# Patient Record
Sex: Male | Born: 1940 | Race: White | Hispanic: No | Marital: Married | Smoking: Former smoker
Health system: Southern US, Community
[De-identification: ages and names within clinical notes are randomized; demographics above are authoritative.]

## PROBLEM LIST (undated history)

## (undated) DIAGNOSIS — E119 Type 2 diabetes mellitus without complications: Secondary | ICD-10-CM

## (undated) DIAGNOSIS — I10 Essential (primary) hypertension: Secondary | ICD-10-CM

## (undated) DIAGNOSIS — I4891 Unspecified atrial fibrillation: Secondary | ICD-10-CM

## (undated) DIAGNOSIS — F329 Major depressive disorder, single episode, unspecified: Secondary | ICD-10-CM

## (undated) DIAGNOSIS — K219 Gastro-esophageal reflux disease without esophagitis: Secondary | ICD-10-CM

## (undated) DIAGNOSIS — F32A Depression, unspecified: Secondary | ICD-10-CM

## (undated) DIAGNOSIS — M353 Polymyalgia rheumatica: Secondary | ICD-10-CM

## (undated) DIAGNOSIS — F419 Anxiety disorder, unspecified: Secondary | ICD-10-CM

## (undated) DIAGNOSIS — J449 Chronic obstructive pulmonary disease, unspecified: Secondary | ICD-10-CM

---

## 1998-04-20 ENCOUNTER — Emergency Department (HOSPITAL_COMMUNITY): Admission: EM | Admit: 1998-04-20 | Discharge: 1998-04-20 | Payer: Self-pay | Admitting: Emergency Medicine

## 1998-04-23 ENCOUNTER — Encounter: Admission: RE | Admit: 1998-04-23 | Discharge: 1998-04-23 | Payer: Self-pay | Admitting: Internal Medicine

## 1998-04-25 ENCOUNTER — Encounter: Payer: Self-pay | Admitting: Cardiology

## 1998-04-25 ENCOUNTER — Ambulatory Visit (HOSPITAL_COMMUNITY): Admission: RE | Admit: 1998-04-25 | Discharge: 1998-04-25 | Payer: Self-pay | Admitting: Cardiology

## 2005-12-31 ENCOUNTER — Encounter: Payer: Self-pay | Admitting: Emergency Medicine

## 2008-05-07 ENCOUNTER — Ambulatory Visit: Payer: Self-pay | Admitting: Family Medicine

## 2010-07-26 ENCOUNTER — Ambulatory Visit: Payer: Self-pay | Admitting: Internal Medicine

## 2014-04-24 ENCOUNTER — Inpatient Hospital Stay: Payer: Self-pay | Admitting: Internal Medicine

## 2014-04-24 LAB — BASIC METABOLIC PANEL
Anion Gap: 8 (ref 7–16)
BUN: 19 mg/dL — ABNORMAL HIGH (ref 7–18)
Calcium, Total: 8.6 mg/dL (ref 8.5–10.1)
Chloride: 104 mmol/L (ref 98–107)
Co2: 32 mmol/L (ref 21–32)
Creatinine: 0.91 mg/dL (ref 0.60–1.30)
EGFR (African American): >60
EGFR (Non-African Amer.): >60
Glucose: 185 mg/dL — ABNORMAL HIGH (ref 65–99)
Osmolality: 294 (ref 275–301)
Potassium: 3.8 mmol/L (ref 3.5–5.1)
Sodium: 144 mmol/L (ref 136–145)

## 2014-04-24 LAB — HEPATIC FUNCTION PANEL A (ARMC)
ALBUMIN: 3.2 g/dL — AB (ref 3.4–5.0)
ALT: 36 U/L
Alkaline Phosphatase: 99 U/L
Bilirubin, Direct: <0.1 mg/dL (ref 0.00–0.20)
Bilirubin,Total: 0.4 mg/dL (ref 0.2–1.0)
SGOT(AST): 12 U/L — ABNORMAL LOW (ref 15–37)
TOTAL PROTEIN: 6.8 g/dL (ref 6.4–8.2)

## 2014-04-24 LAB — CBC WITH DIFFERENTIAL/PLATELET
Basophil #: 0.1 10*3/uL (ref 0.0–0.1)
Basophil %: 0.8 %
Eosinophil #: 0.2 10*3/uL (ref 0.0–0.7)
Eosinophil %: 2 %
HCT: 46.9 % (ref 40.0–52.0)
HGB: 15.3 g/dL (ref 13.0–18.0)
Lymphocyte #: 1.9 10*3/uL (ref 1.0–3.6)
Lymphocyte %: 17.8 %
MCH: 28.8 pg (ref 26.0–34.0)
MCHC: 32.5 g/dL (ref 32.0–36.0)
MCV: 89 fL (ref 80–100)
Monocyte #: 0.7 x10 3/mm (ref 0.2–1.0)
Monocyte %: 7.1 %
Neutrophil #: 7.6 10*3/uL — ABNORMAL HIGH (ref 1.4–6.5)
Neutrophil %: 72.3 %
Platelet: 226 10*3/uL (ref 150–440)
RBC: 5.29 10*6/uL (ref 4.40–5.90)
RDW: 15.8 % — ABNORMAL HIGH (ref 11.5–14.5)
WBC: 10.4 10*3/uL (ref 3.8–10.6)

## 2014-04-24 LAB — CK TOTAL AND CKMB (NOT AT ARMC)
CK, Total: 9 U/L — ABNORMAL LOW
CK-MB: <0.5 ng/mL — ABNORMAL LOW (ref 0.5–3.6)

## 2014-04-24 LAB — HEMOGLOBIN A1C: Hemoglobin A1C: 6.4 % — ABNORMAL HIGH (ref 4.2–6.3)

## 2014-04-24 LAB — PRO B NATRIURETIC PEPTIDE: B-Type Natriuretic Peptide: 52 pg/mL (ref 0–125)

## 2014-04-24 LAB — TROPONIN I: Troponin-I: <0.02 ng/mL

## 2014-04-24 LAB — TSH: THYROID STIMULATING HORM: 2.21 u[IU]/mL

## 2014-04-25 LAB — CBC WITH DIFFERENTIAL/PLATELET
BASOS ABS: 0.1 10*3/uL (ref 0.0–0.1)
Basophil %: 0.9 %
EOS ABS: 0.2 10*3/uL (ref 0.0–0.7)
Eosinophil %: 1.8 %
HCT: 47.3 % (ref 40.0–52.0)
HGB: 15.1 g/dL (ref 13.0–18.0)
LYMPHS PCT: 19.3 %
Lymphocyte #: 2.2 10*3/uL (ref 1.0–3.6)
MCH: 28.6 pg (ref 26.0–34.0)
MCHC: 32 g/dL (ref 32.0–36.0)
MCV: 89 fL (ref 80–100)
MONOS PCT: 6.3 %
Monocyte #: 0.7 x10 3/mm (ref 0.2–1.0)
Neutrophil #: 8.2 10*3/uL — ABNORMAL HIGH (ref 1.4–6.5)
Neutrophil %: 71.7 %
PLATELETS: 254 10*3/uL (ref 150–440)
RBC: 5.29 10*6/uL (ref 4.40–5.90)
RDW: 15.6 % — AB (ref 11.5–14.5)
WBC: 11.4 10*3/uL — ABNORMAL HIGH (ref 3.8–10.6)

## 2014-04-25 LAB — COMPREHENSIVE METABOLIC PANEL
ALT: 39 U/L
Albumin: 3.3 g/dL — ABNORMAL LOW (ref 3.4–5.0)
Alkaline Phosphatase: 115 U/L
Anion Gap: 9 (ref 7–16)
BUN: 27 mg/dL — AB (ref 7–18)
Bilirubin,Total: 0.7 mg/dL (ref 0.2–1.0)
CHLORIDE: 100 mmol/L (ref 98–107)
CO2: 34 mmol/L — AB (ref 21–32)
CREATININE: 1.16 mg/dL (ref 0.60–1.30)
Calcium, Total: 8.4 mg/dL — ABNORMAL LOW (ref 8.5–10.1)
EGFR (Non-African Amer.): >60
Glucose: 158 mg/dL — ABNORMAL HIGH (ref 65–99)
Osmolality: 293 (ref 275–301)
Potassium: 4.2 mmol/L (ref 3.5–5.1)
SGOT(AST): 12 U/L — ABNORMAL LOW (ref 15–37)
Sodium: 143 mmol/L (ref 136–145)
Total Protein: 6.7 g/dL (ref 6.4–8.2)

## 2014-04-25 LAB — TROPONIN I: Troponin-I: <0.02 ng/mL

## 2014-04-25 LAB — CK-MB: CK-MB: <0.5 ng/mL — ABNORMAL LOW (ref 0.5–3.6)

## 2014-12-23 NOTE — Discharge Summary (Signed)
PATIENT NAME:  Tyrone Ruiz, Tyrone Ruiz MR#:  [id number] DATE OF BIRTH:  01-18-1941  ADMISSION DIAGNOSIS: Shortness of breath.   DISCHARGE DIAGNOSES: 1.  Pneumonia, community-acquired.  2.  Obesity hypoventilation.  3.  Diabetes.  4.  History of atrial flutter.  5.  Hypertension.   CONSULTATIONS:[facility].   PERTINENT LABORATORY DATA AND IMAGING: A 2D echocardiogram showed normal ejection fraction without any valvular abnormalities. Discharge white blood cells 11, hemoglobin 15, hematocrit 47.3 and platelets are 254,000, sodium 143, potassium 4.2, chloride 100, bicarbonate 34, BUN 27, creatinine 1.16, glucose is 157.   Hemoglobin A1c is 6.4.   CT of the chest showed no evidence of pulmonary emboli, but did reveal right upper lobe pneumonia.   ECHO normal EF no valvular abnormalities  HOSPITAL COURSE: A 74 year old morbidly obese male who presented with shortness of breath. For further details, please refer to the H and P.  1.  Community-acquired pneumonia. The patient was found to have a pneumonia, not on chest x-ray but on CT scan. He was empirically started on Levaquin 750 mg p.o. q, day. He will be treated for 5 days. He is actually asymptomatic from his pneumonia including no fevers, white count, or cough.  2.  Obesity hypoventilation. The patient described what appears to be hypoventilation syndrome. He has had a sleep study in the past prior to losing 76 pounds. At that time he  had a sleep study and did not require a CPAP machine, but was hypoxic during the day and night, but apparently he says he did not stop breathing so did not qualify for CPAP machine. He did need oxygen at that time, but did not obtain oxygen. He definitely needs 2 liters of oxygen for what appears to be obesity hypoventilation syndrome. He was found to be hypoxic here and this is not felt to be due to his pneumonia, but due to obesity hypoventilation as mentioned. His echocardiogram showed no signs of congestive heart  failure. No atrial flutter on telemetry. EKG initially on admission looked actually to be artifact. Appreciate cardiology consultation at this time. 3.   Diabetes, A1c was 6.4, well-controlled on metformin, ADA diet.   4.  History of atrial flutter, status post cardioversion. Appreciate cardiology consultation. The patient's rate is controlled.  He is on Pradaxa.  5.   Hypertension. The patient's blood pressure is controlled on metoprolol.   DISCHARGE MEDICATIONS: 1.  Metoprolol 50 mg daily.  2.  Pradaxa 20 mg daily.  3.  Lasix 40 mg p.r.n.  4.  Metformin 1000 mg b.i.d.  5.  Tamsulosin 0.4 mg 2 tablets daily.  6.  Vitamin B12, 1000 mcg daily.  7.  Omeprazole 20 mg daily. 8.  CoQ10, 100 mg 2 tablets b.i.d.  9.  L-carnitine 300 mg b.i.d.  10.  Prednisone 5 mg daily. 11.  Xanax 1 mg 4 times a day p.r.n. anxiety.  12.  Remeron 15 mg 2 tablets at bedtime.  13.  Aspirin 81 mg daily. 14.  Levaquin 750 mg daily for 4 days.   DISCHARGE OXYGEN: Two liters nasal cannula.   DISCHARGE DIET:  Low sodium.   DISCHARGE ACTIVITY: As tolerated.   DISCHARGE FOLLOWUP:  The patient will follow up with Dr. Youlanda Mighty at [facility].  TIME SPENT: Approximately 35 minutes. The patient is stable for discharge.    ____________________________ Candace Begue P. Juliene Pina, MD spm:LT D: 04/25/2014 14:40:43 ET T: 04/25/2014 22:10:50 ET JOB#: [id number]  cc: Raelynne Ludwick P. Juliene Pina, MD, <Dictator> Youlanda Mighty,  MD, [facility] Brandie Lopes P Prentis Langdon MD ELECTRONICALLY SIGNED 04/26/2014 10:29

## 2014-12-23 NOTE — H&P (Signed)
PATIENT NAME:  Tyrone PurpuraCRAVEN, Kyo J MR#:  [id number] DATE OF BIRTH:  [birth year]  DATE OF ADMISSION:  04/24/2014  ADDENDUM:  MEDICATIONS: The patient's medication list is as follows: Alprazolam 1 mg 4 times daily as needed, aspirin 81 mg p.o. daily, coenzyme Q10 at 100 mg 2 capsules twice daily, furosemide 40 mg p.o. daily as needed, l-carnitine 300 mg twice daily, metformin 1 gram twice daily, metoprolol succinate 50 mg p.o. daily, Remeron 30 mg p.o. at bedtime, omeprazole 10 mg p.o. daily, prednisone 5 mg p.o. daily, rivaroxaban 20 mg p.o. daily, tamsulosin 0.4 mg 2 capsules once daily, vitamin B12 at 1000 mcg p.o. daily, and Crestor 10 mg p.o. daily. The patient is not sure if Crestor dose is actually 10 mg.    ____________________________ Katharina Caperima Jenascia Bumpass, MD rv:lt D: 04/24/2014 15:25:45 ET T: 04/24/2014 16:01:50 ET JOB#: [id number]  cc: Katharina Caperima Blu Mcglaun, MD, <Dictator> Emmalin Jaquess MD ELECTRONICALLY SIGNED 05/24/2014 14:34

## 2014-12-23 NOTE — Consult Note (Signed)
Present Illness Patient is a 74 year old male with history of atrial fibrillation/atrial flutter [facility] by Dr. Youlanda MightyMichael Blazing.  He has been on a variety of anti arrhythmics and as undrgone   a number of cardioversion attempts.  He presents with complaints of hypoxia and shortness of breath.  [facility] was shortness of breath.  He was given IV diuretics and discharge back to home.  He has a history of chronic hypoxia per his report in [facility].  He had a sleep study in the distant past.  Patient states that he had no apneic episodes but was recommended to use CPAP.  He currently does not use this device.  He has an oxygen meter at home and states that his pulse oximetry her at room air at rest usually runs between 85 and 86. With ambulation usually drops to the low 80s.  He was admitted to our hospital with complaints of shortness of breath and pulse oximetries in the low 80s.  He was placed on nasal cannula oxygen.  Chest x-ray did not reveal significant pulmonary edema.  Echocardiogram with definity contrast added for clarity due to poor sound wave transmission suggested preserved left ventricular function.  Has ruled out for myocardia infarction.  He currently is resting comfortably with 2 L of oxygen.  When ambulating without oxygen his pulse oximetry drops to the low 80s.  There is no evidence of high-grade airspace disease he appears to be well compensated from a cardiac standpoint.  His electrocardiogram appears to show sinus rhythm.  Patient is anticoagulated with rivaroxaban is tolerating this fairly well.   Physical Exam:  GEN no acute distress, obese   HEENT PERRL   NECK supple   RESP normal resp effort  no use of accessory muscles  rhonchi   CARD Regular rate and rhythm  Distant heart sounds the   ABD denies tenderness  more  obese   LYMPH negative neck   EXTR negative  cyanosis/clubbing, positive edema   SKIN positive rashes   NEURO cranial nerves intact, motor/sensory function intact   PSYCH A+O to time, place, person   Review of Systems:  Subjective/Chief Complaint some shortness of breath with activity none at rest.   General: Fatigue   Skin: No Complaints   ENT: No Complaints   Eyes: No Complaints   Neck: No Complaints   Respiratory: Short of breath   Cardiovascular: No Complaints   Gastrointestinal: No Complaints   Genitourinary: No Complaints   Vascular: No Complaints   Musculoskeletal: No Complaints   Neurologic: No Complaints   Hematologic: No Complaints   Endocrine: No Complaints   Psychiatric: No Complaints   Review of Systems: All other systems were reviewed and found to be negative   Medications/Allergies Reviewed Medications/Allergies reviewed   Family & Social History:  Family and Social History:  Family History Non-Contributory   EKG:  EKG NSR   Abnormal NSSTTW changes    No Known Allergies:    Impression 74 year old with history of atrial fib / flutter.  He currently appears to be in sinus rhythm.  He is on metoprolol alone at present.  He is chronically anticoagulated with rivaroxaban.  [facility].  He was admitted with complaints of increasing shortness of breath and hypoxia.  Patient states that he is frequently  is hypoxic at home on room air.  Pulse oximetry drops  to the low 80s when ambulating during a hospitalization.  He states the same thing happens at home.  His echocardiogram with definity contrast revealed left ventricular function appeared normal.  He is back to his baseline per is report however he has had a sleep study in the distant past however this may need to be repeated given his somnolence during the day as well as relative fatigue.  Given his desaturation, would recommend home oxygen least temporarily until improve meds can be made in his  oxygenation.  Would continue with rate control with metoprolol and anticoagulation with rivaroxaban as well as symptomatic relief with diuretics including furosemide.   Plan 1. Continue with current medications including metoprolol succinate at 50 mg daily and rivaroxaban at 20 mg daily 2. Diuresis as needed 3. Would recommend home oxygen at 2 L per  min at least with ambulation and at night he if not with resting comfortably when his pulse oximetry drops below 86. 4. consider sleep study 5. ambulating consider discharge with outpatient follow-up with Dr. Youlanda Mighty at [facility].   Electronic Signatures: Dalia Heading (MD)  (Signed 25-Aug-15 12:19)  Authored: General Aspect/Present Illness, History and Physical Exam, Review of System, Family & Social History, EKG , Allergies, Impression/Plan   Last Updated: 25-Aug-15 12:19 by Dalia Heading (MD)

## 2014-12-23 NOTE — H&P (Signed)
PATIENT NAME:  Tyrone Ruiz, Tyrone Ruiz MR#:  [id number] DATE OF BIRTH:  1941/08/05  DATE OF ADMISSION:  04/24/2014  PRIMARY CARE PHYSICIAN: Dr. Youlanda Mighty, [facility].   HISTORY OF PRESENT ILLNESS: The patient is a 74 year old Caucasian male with past medical history significant for history of atrial flutter in the past, status post cardioversion x 5 history of obstructive sleep apnea, hypoxia during sleep; however, who did not qualify for free CPAP and also history of severe obesity, as well as diabetes mellitus who presents to the hospital with complaints of low oxygen levels. According to the patient, he was doing well up until approximately 3 days ago when he started having shortness of breath. He checked his pulse oximetry and it was between 80s to 90s, running 83% to 89%. He was not able to sleep well because of significant shortness of breath. Apparently in the past, he was seen at [facility] because of hypoxia. At that point, he was given Lasix and his hypoxia would improve. This time he came into the Emergency Room, his chest x-ray did not show any significant congestive heart failure but just bibasilar atelectasis and hospitalist services were contacted for admission. The patient's first EKG revealed uneven baseline which is difficult to rule out from atrial flutter reportedly accelerated junctional rhythm per EKG criteria. The patient denies any significant midsternal chest pain; however, admits to having right-sided chest pains which usually comes up whenever he takes deep breaths; however, seemed to be improving now with oxygen therapy. He is also admitting to fatigue and weakness, and significant dyspnea, especially whenever he walks. He walks at least 30 or 40 feet and then he becomes severely short of breath. He needs to stop at least for 15 minutes and his shortness of breath somewhat improved, but not significantly so. He tells me that his oxygen saturation is 92% whenever he  walks; however, it goes down to 80s whenever he is sitting down. Denies palpitations. As mentioned above he has had at least 5 cardioversions for atrial flutter in the past and did not tolerate medications for atrial flutter conversion and did not tolerate atrial flutter itself.   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 He was off statin Crestor for a year; however, he said myopathy did not improve so he is back now on Crestor.   PAST SURGICAL HISTORY: Significant for cardioversions only. The first one was 10 years ago; at least 5 of them.   FAMILY HISTORY: Hypertension as well as diabetes mellitus in the patient's father. The patient's mother died of pancreatic cancer at age of 80.   SOCIAL HISTORY: The patient is married, has 2 children, lives with his wife. He used to smoke 1 cigar a week for 1 year only and quit 15 years ago. No alcohol abuse. He used to work as an Airline pilot. Retired now.   REVIEW OF SYSTEMS: Positive for fatigue and weakness, pains in the right upper quadrant, right lower chest area increasing with breathing and it is intermittent. Also, progression of cataracts. Takes naps during the daytime due to poor sleep at nighttime; however, the past 3 days  he has not been able to sleep well because of significant shortness of breath. Denies any swelling in his lower extremities. Admits to significant shortness of breath, especially on exertion; walks just 30 feet or so and then gets very short of breath. Ten years ago, was evaluated by the sleep study however, did not stop  breathing and that is why Medicaid did not qualify him for CPAP mask. He had repeated study done after weight loss of approximately 60 to 70 pounds and, at that point, he also was recommended CPAP mask; however, did not receive it since he was not qualified for it. 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 Weak muscles and legs. He is not able to walk in general in the house too much. Denies any fevers or chills, weight loss or gain.  EYES: Denies blurry vision, double vision, glaucoma.  EAR, NOSE AND THROAT: Denies any tinnitus, difficulty hearing or swallowing, no sore throat.  CARDIOVASCULAR: Denies chest pains at present, paplitations, swollen feet.  GASTROINTESTINAL: Denies any vomiting, diarrhea or constipation.  GENITOURINARY: Denies dysuria, hematuria, frequency, incontinence. SKIN: Denies any acne rash, lesions.  MUSCULOSKELETAL: Denies arthritis, swelling, difficulty walking.  PSYCHIATRIC: Denies anxiety, insomnia depression.  On arrival to the hospital, patient's vital signs: Temperature is 98.2, pulse was 83, blood pressure 130/57, saturation was 95% on oxygen therapy and was 83% on room air.  GENERAL: Obese, Caucasian male in no significant distress. CARDIOVASCULAR: S1, S2 present.Rhythm was regular, no murmurs, gallops, rubs. EXTREMITIES: No lower extremity edema, calf tenderness or cyanosis was noted.  ABDOMEN: Protuberant, nontender, soft, no hepatosplenomegaly or masses were noted. Mild discomfort in the right upper quadrant during palpation.  MUSCLE STRENGTH: Able to move all extremities. No cyanosis, degenerative joint disease. SKIN: Did not reveal any rashes, lesions, erythema, nodularity, or induration. No calve tenderness or cyanosis. LYMPHATIC: No adenopathy in the region.  NEUROLOGIC: Cranial nerves grossly  intact. Sensory is intact. No discharge or aphasia. The patient is alert oriented to time, person and place, cooperative. Memory is good.  PSYCHIATRIC: No significant confusion, agitation, or depression noted.   DIAGNOSTIC DATA: EKG showed accelerated junctional rhythm at 78 beats per minute, normal axis, no acute ST-T changes. However, the patient's baseline is very abnormal and I am having difficulty completely ruling out atrial flutter wave. A new EKG is ordered; however, has not received yet.  The patient's lab data: Glucose 185, BUN of 19, beta-type natriuretic peptide was only 52. Otherwise , BMP was unremarkable. Troponin less than 0.02. CBC: White blood cell count of 10.4, hemoglobin 15.3, platelet count 226,000. Absolute neutrophil count was 7.6.   RADIOLOGIC STUDIES: Chest x-ray, portable single view, 04/24/2014, showed a mild bibasilar atelectasis . No focal confluent infiltrate or other abnormalities.   ASSESSMENT AND PLAN: 1. Acute respiratory failure. Admit the patient to the medical floor for evaluation. Suspect obesity hypoventilation as one of reasons why the patient has significant hypoxia,; however, it could be that he has also atrial flutter intermittently, which he did not tolerate well in the past and could be also that he has mild congestive heart failure which is not obvious chest x-ray; however, we need to rule out pulmonary embolism; although, the patient is on Xarelto already. We will get CT scan of the chest with intravenous contrast. We will get repeat EKG done and get  cardiologist involved. Will check cardiac enzymes x 3. We'll initiate CPAP mask. We will get echocardiogram and see if this would help to qualify patient for CPAP mask.  2. Questionable atrial flutter. Will repeat EKG . We will hold metoprolol for now. Will admit patient to telemetry floor. We will also get cardiologist involved for further recommendations.  3. Diabetes mellitus. Get hemoglobin A1c. Will  continue home medications as well as sliding scale insulin.  4. Hypertension. We will continue home medications. 5. Hyperlipidemia. Continue Crestor.   TIME SPENT: 50 minutes    ____________________________ Katharina Caperima Alaija Ruble, MD rv:NT D: 04/24/2014 15:20:01 ET T: 04/24/2014 16:07:34 ET JOB#: [id number]  cc: Katharina Caperima Armarion Greek, MD, <Dictator> Nyala Kirchner MD ELECTRONICALLY SIGNED 05/29/2014 20:54

## 2015-08-24 ENCOUNTER — Encounter: Payer: Self-pay | Admitting: Emergency Medicine

## 2015-08-24 ENCOUNTER — Observation Stay
Admission: EM | Admit: 2015-08-24 | Discharge: 2015-08-25 | Disposition: A | Discharge status: Home / Self Care | Payer: Medicare Other | Attending: Internal Medicine | Admitting: Internal Medicine

## 2015-08-24 ENCOUNTER — Emergency Department: Payer: Medicare Other

## 2015-08-24 DIAGNOSIS — I6523 Occlusion and stenosis of bilateral carotid arteries: Secondary | ICD-10-CM | POA: Insufficient documentation

## 2015-08-24 DIAGNOSIS — E119 Type 2 diabetes mellitus without complications: Secondary | ICD-10-CM

## 2015-08-24 DIAGNOSIS — Z7901 Long term (current) use of anticoagulants: Secondary | ICD-10-CM | POA: Diagnosis not present

## 2015-08-24 DIAGNOSIS — Z9981 Dependence on supplemental oxygen: Secondary | ICD-10-CM | POA: Diagnosis not present

## 2015-08-24 DIAGNOSIS — E785 Hyperlipidemia, unspecified: Secondary | ICD-10-CM | POA: Diagnosis not present

## 2015-08-24 DIAGNOSIS — M8580 Other specified disorders of bone density and structure, unspecified site: Secondary | ICD-10-CM | POA: Insufficient documentation

## 2015-08-24 DIAGNOSIS — J449 Chronic obstructive pulmonary disease, unspecified: Secondary | ICD-10-CM | POA: Insufficient documentation

## 2015-08-24 DIAGNOSIS — I119 Hypertensive heart disease without heart failure: Secondary | ICD-10-CM | POA: Diagnosis not present

## 2015-08-24 DIAGNOSIS — G459 Transient cerebral ischemic attack, unspecified: Secondary | ICD-10-CM | POA: Diagnosis present

## 2015-08-24 DIAGNOSIS — R4701 Aphasia: Secondary | ICD-10-CM | POA: Insufficient documentation

## 2015-08-24 DIAGNOSIS — I482 Chronic atrial fibrillation, unspecified: Secondary | ICD-10-CM | POA: Diagnosis present

## 2015-08-24 DIAGNOSIS — M353 Polymyalgia rheumatica: Secondary | ICD-10-CM | POA: Insufficient documentation

## 2015-08-24 DIAGNOSIS — Z7984 Long term (current) use of oral hypoglycemic drugs: Secondary | ICD-10-CM | POA: Insufficient documentation

## 2015-08-24 DIAGNOSIS — F419 Anxiety disorder, unspecified: Secondary | ICD-10-CM | POA: Diagnosis not present

## 2015-08-24 DIAGNOSIS — Z7952 Long term (current) use of systemic steroids: Secondary | ICD-10-CM | POA: Diagnosis not present

## 2015-08-24 DIAGNOSIS — K219 Gastro-esophageal reflux disease without esophagitis: Secondary | ICD-10-CM | POA: Diagnosis not present

## 2015-08-24 DIAGNOSIS — F329 Major depressive disorder, single episode, unspecified: Secondary | ICD-10-CM | POA: Diagnosis not present

## 2015-08-24 DIAGNOSIS — I10 Essential (primary) hypertension: Secondary | ICD-10-CM | POA: Diagnosis present

## 2015-08-24 DIAGNOSIS — Z87891 Personal history of nicotine dependence: Secondary | ICD-10-CM | POA: Diagnosis not present

## 2015-08-24 DIAGNOSIS — Z79899 Other long term (current) drug therapy: Secondary | ICD-10-CM | POA: Diagnosis not present

## 2015-08-24 DIAGNOSIS — R531 Weakness: Secondary | ICD-10-CM | POA: Insufficient documentation

## 2015-08-24 DIAGNOSIS — L8990 Pressure ulcer of unspecified site, unspecified stage: Secondary | ICD-10-CM | POA: Insufficient documentation

## 2015-08-24 HISTORY — DX: Major depressive disorder, single episode, unspecified: F32.9

## 2015-08-24 HISTORY — DX: Gastro-esophageal reflux disease without esophagitis: K21.9

## 2015-08-24 HISTORY — DX: Type 2 diabetes mellitus without complications: E11.9

## 2015-08-24 HISTORY — DX: Essential (primary) hypertension: I10

## 2015-08-24 HISTORY — DX: Anxiety disorder, unspecified: F41.9

## 2015-08-24 HISTORY — DX: Unspecified atrial fibrillation: I48.91

## 2015-08-24 HISTORY — DX: Depression, unspecified: F32.A

## 2015-08-24 HISTORY — DX: Polymyalgia rheumatica: M35.3

## 2015-08-24 LAB — COMPREHENSIVE METABOLIC PANEL
ALT: 10 U/L — ABNORMAL LOW (ref 17–63)
AST: 12 U/L — AB (ref 15–41)
Albumin: 4.1 g/dL (ref 3.5–5.0)
Alkaline Phosphatase: 75 U/L (ref 38–126)
Anion gap: 11 (ref 5–15)
BUN: 20 mg/dL (ref 6–20)
CHLORIDE: 101 mmol/L (ref 101–111)
CO2: 30 mmol/L (ref 22–32)
Calcium: 9 mg/dL (ref 8.9–10.3)
Creatinine, Ser: 0.8 mg/dL (ref 0.61–1.24)
Glucose, Bld: 131 mg/dL — ABNORMAL HIGH (ref 65–99)
POTASSIUM: 4.1 mmol/L (ref 3.5–5.1)
Sodium: 142 mmol/L (ref 135–145)
Total Bilirubin: 0.7 mg/dL (ref 0.3–1.2)
Total Protein: 6.9 g/dL (ref 6.5–8.1)

## 2015-08-24 LAB — CBC
HCT: 45 % (ref 40.0–52.0)
Hemoglobin: 14.5 g/dL (ref 13.0–18.0)
MCH: 27.5 pg (ref 26.0–34.0)
MCHC: 32.2 g/dL (ref 32.0–36.0)
MCV: 85.6 fL (ref 80.0–100.0)
PLATELETS: 251 10*3/uL (ref 150–440)
RBC: 5.26 MIL/uL (ref 4.40–5.90)
RDW: 15.3 % — AB (ref 11.5–14.5)
WBC: 10.1 10*3/uL (ref 3.8–10.6)

## 2015-08-24 LAB — PROTIME-INR
INR: 1.81
PROTHROMBIN TIME: 20.9 s — AB (ref 11.4–15.0)

## 2015-08-24 LAB — TROPONIN I

## 2015-08-24 MED ORDER — MIRTAZAPINE 30 MG PO TABS
30.0000 mg | ORAL_TABLET | Freq: Every day | ORAL | Status: DC
Start: 2015-08-24 — End: 2015-08-25
  Administered 2015-08-24: 30 mg via ORAL
  Filled 2015-08-24 (×2): qty 1

## 2015-08-24 MED ORDER — ALPRAZOLAM 1 MG PO TABS
1.0000 mg | ORAL_TABLET | Freq: Four times a day (QID) | ORAL | Status: DC | PRN
  Administered 2015-08-24 – 2015-08-25 (×2): 1 mg via ORAL
  Filled 2015-08-24: qty 1
  Filled 2015-08-24: qty 2

## 2015-08-24 MED ORDER — ROSUVASTATIN CALCIUM 10 MG PO TABS
10.0000 mg | ORAL_TABLET | Freq: Every day | ORAL | Status: DC
Start: 2015-08-25 — End: 2015-08-25
  Administered 2015-08-24: 10 mg via ORAL
  Filled 2015-08-24 (×3): qty 1

## 2015-08-24 NOTE — ED Notes (Signed)
Patient to rm 18 via EMS from home.  Per EMS and patient - he was sitting on bedside commode and was straining.  Reports he felt strange and tried to communicate with his wife, but that she could not hear/understand him.

## 2015-08-24 NOTE — ED Notes (Signed)
Patient transported to CT 

## 2015-08-24 NOTE — ED Provider Notes (Signed)
[facility] Emergency Department Provider Note  Time seen: 9:17 PM  I have reviewed the triage vital signs and the nursing notes.   HISTORY  Chief Complaint Weakness    HPI Tyrone Deskins Sr. is a 74 y.o. male with a past medical history of diabetes, gastric reflux, morbid obesity, atrial fibrillation on xarelto who presents the emergency department for aphasia. According to the patient he was using the bathroom, had just finished when he was speaking with his wife he states he was talking to her but she states that she could not understand anything he was saying. She states it was just jumbled words that did not make sense, but the patient believed he was making sense. Patient denies any weakness or numbness of any arm or leg, confusion. Patient recalls the event states he feels like he was making sense, the wife states this lasted for approximately 30 minutes before slowly resolved. Patient denies any history of this in the past, denies any history of stroke in the past. Denies any headache.     Past Medical History  Diagnosis Date  . Diabetes mellitus without complication (HCC)   . Atrial fibrillation (HCC)   . GERD (gastroesophageal reflux disease)     There are no active problems to display for this patient.   History reviewed. No pertinent past surgical history.  No current outpatient prescriptions on file.  Allergies Review of patient's allergies indicates no known allergies.  No family history on file.  Social History Social History  Substance Use Topics  . Smoking status: Former Games developer  . Smokeless tobacco: Never Used  . Alcohol Use: No    Review of Systems Constitutional: Negative for fever. Cardiovascular: Negative for chest pain. Respiratory: Negative for shortness of breath. Gastrointestinal: Negative for abdominal pain Neurological: Negative for headache 10-point ROS otherwise  negative.  ____________________________________________   PHYSICAL EXAM:  VITAL SIGNS: ED Triage Vitals  Enc Vitals Group     BP 08/24/15 2109 125/72 mmHg     Pulse Rate 08/24/15 2109 82     Resp 08/24/15 2109 20     Temp 08/24/15 2111 97.5 F (36.4 C)     Temp Source 08/24/15 2111 Oral     SpO2 08/24/15 2111 99 %     Weight 08/24/15 2109 341 lb 6 oz (154.847 kg)     Height 08/24/15 2109  (1.854 m)     Head Cir --      Peak Flow --      Pain Score --      Pain Loc --      Pain Edu? --      Excl. in GC? --     Constitutional: Alert and oriented. Well appearing and in no distress. Eyes: Normal exam ENT   Head: Normocephalic and atraumatic.   Mouth/Throat: Mucous membranes are moist. Cardiovascular: Normal rate, regular rhythm. No murmur Respiratory: Normal respiratory effort without tachypnea nor retractions. Breath sounds are clear and equal bilaterally. No wheezes/rales/rhonchi. Gastrointestinal: Soft and nontender. No distention.  Musculoskeletal: Nontender with normal range of motion in all extremities.  Neurologic:  Normal speech and language. No gross focal neurologic deficits. Cranial nerves intact. Equal grip strengths.  Skin:  Skin is warm, dry and intact.  Psychiatric: Mood and affect are normal. Speech and behavior are normal.   ____________________________________________    EKG  EKG reviewed and interpreted by myself shows atrial fibrillation at 82 bpm, narrow QRS, left axis deviation, nonspecific ST changes.  ____________________________________________    RADIOLOGY  CT head shows no acute abnormality  ____________________________________________   INITIAL IMPRESSION / ASSESSMENT AND PLAN / ED COURSE  Pertinent labs & imaging results that were available during my care of the patient were reviewed by me and considered in my medical decision making (see chart for details).  Patient presents with symptoms most suggestive of transient  ischemic attack with what appears to be consistent with Wernicke's aphasia. We will check labs, CT head, and closely monitor in the emergency department. The patient does have an underlying history of atrial fibrillation however states he takes xarelto daily and denies missing any dosages.  Labs and CT a largely within normal limits besides a slightly elevated INR, patient is not on Coumadin. Normal LFTs. Given the patient's concerning symptoms for a transient ischemic attack, the patient will be admitted to the hospital for further treatment and evaluation.  ____________________________________________   FINAL CLINICAL IMPRESSION(S) / ED DIAGNOSES  Transient ischemic attack   Minna AntisKevin Taichi Repka, MD 08/24/15 2251

## 2015-08-24 NOTE — ED Notes (Signed)
Per [facility] pt census is over their grid so charge [facility] and [facility] whether to [facility] more pt's.

## 2015-08-24 NOTE — H&P (Signed)
[facility] -  at [facility]   PATIENT NAME: Tyrone Ruiz    MR#:  [id number]  DATE OF BIRTH:  11/23/1940  DATE OF ADMISSION:  08/24/2015  PRIMARY CARE PHYSICIAN: [facility]   REQUESTING/REFERRING PHYSICIAN: Lenard LancePaduchowski, MD  CHIEF COMPLAINT:   Chief Complaint  Patient presents with  . Weakness    HISTORY OF PRESENT ILLNESS:  Tyrone Ruiz  is a 74 y.o. male who presents with an episode of speech difficulty. Patient states that earlier this evening he was at home with his wife. He states that he was using his bedside commode and straining to have a bowel movement. Immediately after that he began have difficulty speaking. Patient states that he was able to think about what he wanted to say, but that everything came out garbled and "mumbling". This lasted for about 5 minutes, then the patient was slowly able to get order to out here or there. By 10 or 15 minutes later his speech had completely returned to normal. Patient is in chronic A. fib, but is anticoagulated. Hospitalists were called for admission for TIA.  PAST MEDICAL HISTORY:   Past Medical History  Diagnosis Date  . Diabetes mellitus without complication (HCC)   . Atrial fibrillation (HCC)   . GERD (gastroesophageal reflux disease)   . HTN (hypertension)   . Anxiety   . Depression   . PMR (polymyalgia rheumatica) (HCC)     PAST SURGICAL HISTORY:  History reviewed. No pertinent past surgical history.  SOCIAL HISTORY:   Social History  Substance Use Topics  . Smoking status: Former Games developermoker  . Smokeless tobacco: Never Used  . Alcohol Use: No    FAMILY HISTORY:   Family History  Problem Relation Age of Onset  . Hypertension Father   . Diabetes Father   . Cancer Father   . Stroke Father   . Alcohol abuse Father   . Depression Sister   . Diabetes Sister   . Cancer Mother   . Stroke Mother     DRUG ALLERGIES:  No Known Allergies  MEDICATIONS AT HOME:    Prior to Admission medications   Medication Sig Start Date End Date Taking? Authorizing Provider  ALPRAZolam Prudy Feeler(XANAX) 1 MG tablet Take 1 mg by mouth 4 (four) times daily as needed for anxiety.   Yes Historical Provider, MD  busPIRone (BUSPAR) 10 MG tablet Take 5 mg by mouth 2 (two) times daily.    Yes Historical Provider, MD  furosemide (LASIX) 40 MG tablet Take 40 mg by mouth daily as needed for fluid.    Yes Historical Provider, MD  glimepiride (AMARYL) 4 MG tablet Take 4 mg by mouth 2 (two) times daily as needed.    Yes Historical Provider, MD  metFORMIN (GLUCOPHAGE) 1000 MG tablet Take 1,000 mg by mouth 2 (two) times daily with a meal.   Yes Historical Provider, MD  metoprolol succinate (TOPROL-XL) 50 MG 24 hr tablet Take 50 mg by mouth daily. Pt states that he takes anywhere from 2-4 tablets daily depending on A-FIB.   Yes Historical Provider, MD  mirtazapine (REMERON) 30 MG tablet Take 30 mg by mouth at bedtime. Take 2 tablets.   Yes Historical Provider, MD  predniSONE (DELTASONE) 5 MG tablet Take 5 mg by mouth daily with breakfast.   Yes Historical Provider, MD  rivaroxaban (XARELTO) 20 MG TABS tablet Take 20 mg by mouth daily with supper.   Yes Historical Provider, MD  rosuvastatin (CRESTOR) 10 MG  tablet Take 1 tablet by mouth daily. 07/05/15  Yes Historical Provider, MD  tamsulosin (FLOMAX) 0.4 MG CAPS capsule Take 2 capsules by mouth daily. 08/18/15  Yes Historical Provider, MD    REVIEW OF SYSTEMS:  Review of Systems  Constitutional: Negative for fever, chills, weight loss and malaise/fatigue.  HENT: Negative for ear pain, hearing loss and tinnitus.   Eyes: Negative for blurred vision, double vision, pain and redness.  Respiratory: Negative for cough, hemoptysis and shortness of breath.   Cardiovascular: Negative for chest pain, palpitations, orthopnea and leg swelling.  Gastrointestinal: Negative for nausea, vomiting, abdominal pain, diarrhea and constipation.  Genitourinary:  Negative for dysuria, frequency and hematuria.  Musculoskeletal: Negative for back pain, joint pain and neck pain.  Skin:       No acne, rash, or lesions  Neurological: Positive for speech change. Negative for dizziness, tremors, focal weakness and weakness.  Endo/Heme/Allergies: Negative for polydipsia. Does not bruise/bleed easily.  Psychiatric/Behavioral: Negative for depression. The patient is not nervous/anxious and does not have insomnia.      VITAL SIGNS:   Filed Vitals:   08/24/15 2111 08/24/15 2130 08/24/15 2200 08/24/15 2230  BP:  127/69 101/61 94/48  Pulse:    87  Temp: 97.5 F (36.4 C)     TempSrc: Oral     Resp:   19 18  Height:      Weight:      SpO2: 99%   93%   Wt Readings from Last 3 Encounters:  08/24/15 154.847 kg (341 lb 6 oz)    PHYSICAL EXAMINATION:  Physical Exam  Vitals reviewed. Constitutional: He is oriented to person, place, and time. He appears well-developed and well-nourished. No distress.  HENT:  Head: Normocephalic and atraumatic.  Mouth/Throat: Oropharynx is clear and moist.  Eyes: Conjunctivae and EOM are normal. Pupils are equal, round, and reactive to light. No scleral icterus.  Neck: Normal range of motion. Neck supple. No JVD present. No thyromegaly present.  Cardiovascular: Normal rate and intact distal pulses.  Exam reveals no gallop and no friction rub.   No murmur heard. Irregular rhythm  Respiratory: Effort normal and breath sounds normal. No respiratory distress. He has no wheezes. He has no rales.  GI: Soft. Bowel sounds are normal. He exhibits no distension. There is no tenderness.  Musculoskeletal: Normal range of motion. He exhibits no edema.  No arthritis, no gout  Lymphadenopathy:    He has no cervical adenopathy.  Neurological: He is alert and oriented to person, place, and time. No cranial nerve deficit.  Neurologic: Cranial nerves II-XII intact, Sensation intact to light touch/pinprick, 5/5 strength in all extremities,  no dysarthria, no aphasia, no dysphagia, memory intact, finger to nose testing showed no abnormality, no pronator drift, DTR intact, Babinski sign not present.   Skin: Skin is warm and dry. No rash noted. No erythema.  Psychiatric: He has a normal mood and affect. His behavior is normal. Judgment and thought content normal.    LABORATORY PANEL:   CBC  Recent Labs Lab 08/24/15 2147  WBC 10.1  HGB 14.5  HCT 45.0  PLT 251   ------------------------------------------------------------------------------------------------------------------  Chemistries   Recent Labs Lab 08/24/15 2147  NA 142  K 4.1  CL 101  CO2 30  GLUCOSE 131*  BUN 20  CREATININE 0.80  CALCIUM 9.0  AST 12*  ALT 10*  ALKPHOS 75  BILITOT 0.7   ------------------------------------------------------------------------------------------------------------------  Cardiac Enzymes  Recent Labs Lab 08/24/15 2147  TROPONINI <0.03   ------------------------------------------------------------------------------------------------------------------  RADIOLOGY:  Ct Head Wo Contrast  08/24/2015  CLINICAL DATA:  Initial evaluation for acute aphasia, now resolved. EXAM: CT HEAD WITHOUT CONTRAST TECHNIQUE: Contiguous axial images were obtained from the base of the skull through the vertex without intravenous contrast. COMPARISON:  None. FINDINGS: Age appropriate cerebral atrophy present. Very mild chronic small vessel ischemic type changes within the periventricular white matter. Vascular calcifications within the carotid siphons. No acute large vessel territory infarct. No intracranial hemorrhage. No mass lesion, midline shift, or mass effect. No hydrocephalus. No extra axial fluid collection. Scalp soft tissues within normal limits. No acute abnormality about the orbits. Partially visualized paranasal sinuses are clear. Visualized mastoid air cells clear. Calvarium intact.  Hyperostosis frontalis interna noted.  IMPRESSION: 1. No acute intracranial process. 2. Mild age related cerebral atrophy with mild chronic small vessel ischemic disease. Electronically Signed   By: Rise Mu M.D.   On: 08/24/2015 21:42    EKG:   Orders placed or performed during the hospital encounter of 08/24/15  . EKG 12-Lead  . EKG 12-Lead    IMPRESSION AND PLAN:  Principal Problem:   TIA (transient ischemic attack) - we'll admit for standard stroke/TIA workup, including MRI/MRA, carotid Doppler, echocardiogram, fasting lipids, A1c, neurology consult. Active Problems:   Type 2 diabetes mellitus (HCC) - heart healthy/carb modified diet once he passes his nursing swallow screen along with sliding scale insulin and corresponding glucose checks. Hemoglobin A1c as above.   Chronic a-fib (HCC) - patient is on rate controlling medicines with a good rate, and is artery anticoagulated. Continue these treatments.   HTN (hypertension) - blood pressure is well controlled, continue home meds   PMR (polymyalgia rheumatica) (HCC) - on chronic prednisone, will not stop this here.   Anxiety - continue home anxiolytic   HLD (hyperlipidemia) - continue home cholesterol medicine  All the records are reviewed and case discussed with ED provider. Management plans discussed with the patient and/or family.  DVT PROPHYLAXIS: Systemic anticoagulation  GI PROPHYLAXIS: None  ADMISSION STATUS: Observation  CODE STATUS: Full Advance Directive Documentation        Most Recent Value   Type of Advance Directive  Living will   Pre-existing out of facility DNR order (yellow form or pink MOST form)     "MOST" Form in Place?        TOTAL TIME TAKING CARE OF THIS PATIENT: 40 minutes.    Cuauhtemoc Huegel FIELDING 08/24/2015, 11:08 PM  [facility]  Office  [phone]  CC: Primary care physician; [facility]

## 2015-08-25 ENCOUNTER — Observation Stay: Payer: Medicare Other

## 2015-08-25 ENCOUNTER — Observation Stay (HOSPITAL_BASED_OUTPATIENT_CLINIC_OR_DEPARTMENT_OTHER)
Admit: 2015-08-25 | Discharge: 2015-08-25 | Disposition: A | Discharge status: Home / Self Care | Payer: Medicare Other | Attending: Internal Medicine | Admitting: Internal Medicine

## 2015-08-25 DIAGNOSIS — G451 Carotid artery syndrome (hemispheric): Secondary | ICD-10-CM | POA: Diagnosis not present

## 2015-08-25 DIAGNOSIS — G459 Transient cerebral ischemic attack, unspecified: Secondary | ICD-10-CM

## 2015-08-25 DIAGNOSIS — L8990 Pressure ulcer of unspecified site, unspecified stage: Secondary | ICD-10-CM | POA: Insufficient documentation

## 2015-08-25 LAB — LIPID PANEL
Cholesterol: 102 mg/dL (ref 0–200)
HDL: 30 mg/dL — ABNORMAL LOW (ref >40)
LDL Cholesterol: 45 mg/dL (ref 0–99)
Total CHOL/HDL Ratio: 3.4 RATIO
Triglycerides: 135 mg/dL (ref <150)
VLDL: 27 mg/dL (ref 0–40)

## 2015-08-25 LAB — GLUCOSE, CAPILLARY
GLUCOSE-CAPILLARY: 151 mg/dL — AB (ref 65–99)
GLUCOSE-CAPILLARY: 183 mg/dL — AB (ref 65–99)

## 2015-08-25 LAB — HEMOGLOBIN A1C: Hgb A1c MFr Bld: 6.9 % — ABNORMAL HIGH (ref 4.0–6.0)

## 2015-08-25 MED ORDER — TAMSULOSIN HCL 0.4 MG PO CAPS
0.8000 mg | ORAL_CAPSULE | Freq: Every day | ORAL | Status: DC
  Filled 2015-08-25: qty 2

## 2015-08-25 MED ORDER — METFORMIN HCL 500 MG PO TABS
1000.0000 mg | ORAL_TABLET | Freq: Two times a day (BID) | ORAL | Status: DC
  Administered 2015-08-25: 08:00:00 1000 mg via ORAL
  Filled 2015-08-25: qty 2

## 2015-08-25 MED ORDER — ASPIRIN 325 MG PO TABS: 325.0000 mg | ORAL_TABLET | Freq: Every day | ORAL | Status: DC

## 2015-08-25 MED ORDER — METFORMIN HCL 1000 MG PO TABS: 1000.0000 mg | ORAL_TABLET | Freq: Two times a day (BID) | ORAL | Status: DC

## 2015-08-25 MED ORDER — METOPROLOL SUCCINATE ER 50 MG PO TB24
50.0000 mg | ORAL_TABLET | Freq: Every day | ORAL | Status: DC
  Administered 2015-08-25: 50 mg via ORAL
  Filled 2015-08-25: qty 1

## 2015-08-25 MED ORDER — FUROSEMIDE 40 MG PO TABS: 40.0000 mg | ORAL_TABLET | Freq: Every day | ORAL | Status: DC | PRN

## 2015-08-25 MED ORDER — INSULIN ASPART 100 UNIT/ML ~~LOC~~ SOLN: 0.0000 [IU] | Freq: Every day | SUBCUTANEOUS | Status: DC

## 2015-08-25 MED ORDER — METFORMIN HCL 1000 MG PO TABS: 1000.0000 mg | ORAL_TABLET | Freq: Two times a day (BID) | ORAL | Status: AC

## 2015-08-25 MED ORDER — RIVAROXABAN 20 MG PO TABS: 20.0000 mg | ORAL_TABLET | Freq: Every day | ORAL | Status: DC

## 2015-08-25 MED ORDER — INSULIN ASPART 100 UNIT/ML ~~LOC~~ SOLN
0.0000 [IU] | Freq: Three times a day (TID) | SUBCUTANEOUS | Status: DC
  Filled 2015-08-25 (×2): qty 1

## 2015-08-25 MED ORDER — PREDNISONE 5 MG PO TABS
5.0000 mg | ORAL_TABLET | Freq: Every day | ORAL | Status: DC
  Administered 2015-08-25: 08:00:00 5 mg via ORAL
  Filled 2015-08-25: qty 1

## 2015-08-25 MED ORDER — STROKE: EARLY STAGES OF RECOVERY BOOK
Freq: Once | Status: AC
  Administered 2015-08-25: 01:00:00

## 2015-08-25 MED ORDER — SENNOSIDES-DOCUSATE SODIUM 8.6-50 MG PO TABS: 1.0000 | ORAL_TABLET | Freq: Every evening | ORAL | Status: DC | PRN

## 2015-08-25 MED ORDER — BUSPIRONE HCL 10 MG PO TABS
5.0000 mg | ORAL_TABLET | Freq: Two times a day (BID) | ORAL | Status: DC
  Filled 2015-08-25: qty 1

## 2015-08-25 MED ORDER — METOPROLOL SUCCINATE ER 50 MG PO TB24: 50.0000 mg | ORAL_TABLET | Freq: Every day | ORAL | Status: AC

## 2015-08-25 NOTE — Discharge Summary (Signed)
[facility] - [place] at [facility]   PATIENT NAME: Tyrone Ruiz    MR#:  [id number]  DATE OF BIRTH:  08-02-1941  DATE OF ADMISSION:  08/24/2015 ADMITTING PHYSICIAN: Oralia Manis, MD  DATE OF DISCHARGE: 08/25/15  PRIMARY CARE PHYSICIAN: [facility]    ADMISSION DIAGNOSIS:  TIA (transient ischemic attack) [G45.9] Transient cerebral ischemia, unspecified transient cerebral ischemia type [G45.9]  DISCHARGE DIAGNOSIS:  Principal Problem:   TIA (transient ischemic attack) Active Problems:   Type 2 diabetes mellitus (HCC)   Chronic a-fib (HCC)   Anxiety   HTN (hypertension)   HLD (hyperlipidemia)   PMR (polymyalgia rheumatica) (HCC)   Pressure ulcer   SECONDARY DIAGNOSIS:   Past Medical History  Diagnosis Date  . Diabetes mellitus without complication (HCC)   . Atrial fibrillation (HCC)   . GERD (gastroesophageal reflux disease)   . HTN (hypertension)   . Anxiety   . Depression   . PMR (polymyalgia rheumatica) [facility])     HOSPITAL COURSE:   74 year old male with past medical history significant for diabetes mellitus, chronic atrial fibrillation on Xarelto, GERD, hypertension, polymyalgia, COPD on home oxygen presents to the hospital secondary to speech changes.  #1 TIA-possible transient hypoxia or hypotension post-straining. Likely a vagal episode. -Speech changes are completely resolved. No other neurological deficits noted. -MRI cannot be done due to his weight and size. CT of the head negative for any acute findings on admission. -Carotid Dopplers with no hemodynamically significant stenosis. -Stable neuro checks. -Appreciate neurology consult. -Patient will be discharged home. No changes in medications. -Already on Xarelto and statin  #2 chronic atrial fibrillation-rate controlled-on metoprolol. -Blood pressure on low normal side. Advise to hold metoprolol as needed. -Xarelto for anticoagulation  #3 polymyalgia-stable at  baseline. On low-dose prednisone chronically.  #4 diabetes mellitus-on metformin and glimepiride.  #5 anxiety-continue home medications without any changes.  Patient is stable and will be discharged home today.   DISCHARGE CONDITIONS:   Stable  CONSULTS OBTAINED:  Treatment Team:  Pauletta Browns, MD  DRUG ALLERGIES:  No Known Allergies  DISCHARGE MEDICATIONS:   Current Discharge Medication List    CONTINUE these medications which have CHANGED   Details  metFORMIN (GLUCOPHAGE) 1000 MG tablet Take 1 tablet (1,000 mg total) by mouth 2 (two) times daily with a meal. Qty: 60 tablet, Refills: 2    metoprolol succinate (TOPROL-XL) 50 MG 24 hr tablet Take 1 tablet (50 mg total) by mouth daily. HOLD MEDICINE IF BLOOD PRESSURE IS LOW, LIKE IF THE TOP (SYSTOLIC) IS <100 OR BOTTOM (DIASTOLIC)< 55 Qty: 30 tablet, Refills: 0      CONTINUE these medications which have NOT CHANGED   Details  ALPRAZolam (XANAX) 1 MG tablet Take 1 mg by mouth 4 (four) times daily as needed for anxiety.    busPIRone (BUSPAR) 10 MG tablet Take 5 mg by mouth 2 (two) times daily.     furosemide (LASIX) 40 MG tablet Take 40 mg by mouth daily as needed for fluid.     glimepiride (AMARYL) 4 MG tablet Take 4 mg by mouth 2 (two) times daily as needed.     mirtazapine (REMERON) 30 MG tablet Take 30 mg by mouth at bedtime. Take 2 tablets.    predniSONE (DELTASONE) 5 MG tablet Take 5 mg by mouth daily with breakfast.    rivaroxaban (XARELTO) 20 MG TABS tablet Take 20 mg by mouth daily with supper.    rosuvastatin (CRESTOR) 10 MG tablet Take  1 tablet by mouth daily. Refills: 3    tamsulosin (FLOMAX) 0.4 MG CAPS capsule Take 2 capsules by mouth daily.         DISCHARGE INSTRUCTIONS:   1. PCP f/u in 1-2 weeks  If you experience worsening of your admission symptoms, develop shortness of breath, life threatening emergency, suicidal or homicidal thoughts you must seek medical attention immediately by  calling 911 or calling your MD immediately  if symptoms less severe.  You Must read complete instructions/literature along with all the possible adverse reactions/side effects for all the Medicines you take and that have been prescribed to you. Take any new Medicines after you have completely understood and accept all the possible adverse reactions/side effects.   Please note  You were cared for by a hospitalist during your hospital stay. If you have any questions about your discharge medications or the care you received while you were in the hospital after you are discharged, you can call the unit and asked to speak with the hospitalist on call if the hospitalist that took care of you is not available. Once you are discharged, your primary care physician will handle any further medical issues. Please note that NO REFILLS for any discharge medications will be authorized once you are discharged, as it is imperative that you return to your primary care physician (or establish a relationship with a primary care physician if you do not have one) for your aftercare needs so that they can reassess your need for medications and monitor your lab values.    Today   CHIEF COMPLAINT:   Chief Complaint  Patient presents with  . Weakness    VITAL SIGNS:  Blood pressure 100/58, pulse 88, temperature 97.7 F (36.5 C), temperature source Oral, resp. rate 20, height  (1.854 m), weight 154.677 kg (341 lb), SpO2 97 %.  I/O:   Intake/Output Summary (Last 24 hours) at 08/25/15 1313 Last data filed at 08/25/15 0800  Gross per 24 hour  Intake      0 ml  Output      0 ml  Net      0 ml    PHYSICAL EXAMINATION:   Physical Exam  GENERAL:  74 y.o.-year-old Obese patient sitting in the bed with no acute distress.  EYES: Pupils equal, round, reactive to light and accommodation. No scleral icterus. Extraocular muscles intact.  HEENT: Head atraumatic, normocephalic. Oropharynx and nasopharynx clear.   NECK:  Supple, no jugular venous distention. No thyroid enlargement, no tenderness.  LUNGS: Normal breath sounds bilaterally, no wheezing, rales,rhonchi or crepitation. No use of accessory muscles of respiration.  CARDIOVASCULAR: S1, S2 normal. No murmurs, rubs, or gallops.  ABDOMEN: Soft, non-tender, non-distended. Bowel sounds present. No organomegaly or mass.  EXTREMITIES: No pedal edema, cyanosis, or clubbing.  NEUROLOGIC: Cranial nerves II through XII are intact. Muscle strength 5/5 in all extremities. Sensation intact. Gait not checked.  PSYCHIATRIC: The patient is alert and oriented x 3.  SKIN: No obvious rash, lesion, or ulcer.   DATA REVIEW:   CBC  Recent Labs Lab 08/24/15 2147  WBC 10.1  HGB 14.5  HCT 45.0  PLT 251    Chemistries   Recent Labs Lab 08/24/15 2147  NA 142  K 4.1  CL 101  CO2 30  GLUCOSE 131*  BUN 20  CREATININE 0.80  CALCIUM 9.0  AST 12*  ALT 10*  ALKPHOS 75  BILITOT 0.7    Cardiac Enzymes  Recent Labs Lab 08/24/15 2147  TROPONINI <0.03    Microbiology Results  No results found for this or any previous visit.  RADIOLOGY:  Ct Head Wo Contrast  08/24/2015  CLINICAL DATA:  Initial evaluation for acute aphasia, now resolved. EXAM: CT HEAD WITHOUT CONTRAST TECHNIQUE: Contiguous axial images were obtained from the base of the skull through the vertex without intravenous contrast. COMPARISON:  None. FINDINGS: Age appropriate cerebral atrophy present. Very mild chronic small vessel ischemic type changes within the periventricular white matter. Vascular calcifications within the carotid siphons. No acute large vessel territory infarct. No intracranial hemorrhage. No mass lesion, midline shift, or mass effect. No hydrocephalus. No extra axial fluid collection. Scalp soft tissues within normal limits. No acute abnormality about the orbits. Partially visualized paranasal sinuses are clear. Visualized mastoid air cells clear. Calvarium intact.   Hyperostosis frontalis interna noted. IMPRESSION: 1. No acute intracranial process. 2. Mild age related cerebral atrophy with mild chronic small vessel ischemic disease. Electronically Signed   By: Rise MuBenjamin  McClintock M.D.   On: 08/24/2015 21:42   [place] Carotid Bilateral  08/25/2015  CLINICAL DATA:  74 year old male with symptoms of transient ischemic attack EXAM: BILATERAL CAROTID DUPLEX ULTRASOUND TECHNIQUE: Wallace CullensGray scale imaging, color Doppler and duplex ultrasound were performed of bilateral carotid and vertebral arteries in the neck. COMPARISON:  Head CT 08/24/2015 FINDINGS: Criteria: Quantification of carotid stenosis is based on velocity parameters that correlate the residual internal carotid diameter with NASCET-based stenosis levels, using the diameter of the distal internal carotid lumen as the denominator for stenosis measurement. The following velocity measurements were obtained: RIGHT ICA:  69/9 cm/sec CCA:  66/11 cm/sec SYSTOLIC ICA/CCA RATIO:  1.0 DIASTOLIC ICA/CCA RATIO:  0.8 ECA:  63 cm/sec LEFT ICA:  87/14 cm/sec CCA:  113/20 cm/sec SYSTOLIC ICA/CCA RATIO:  0.8 DIASTOLIC ICA/CCA RATIO:  0.7 ECA:  82 cm/sec RIGHT CAROTID ARTERY: Small focal heterogeneous atherosclerotic plaque in the proximal internal carotid artery. By peak systolic velocity criteria the estimated stenosis remains less than 50%. RIGHT VERTEBRAL ARTERY:  Patent with antegrade flow. LEFT CAROTID ARTERY: Trace heterogeneous atherosclerotic plaque in the carotid bifurcation and proximal internal carotid artery. By peak systolic velocity criteria, the estimated stenosis remains less than 50%. LEFT VERTEBRAL ARTERY:  Patent with antegrade flow. IMPRESSION: 1. 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 2. Mild (1-49%) stenosis proximal left internal carotid artery secondary to mild heterogeneous atherosclerotic plaque. 3. Vertebral arteries are patent with  normal antegrade flow. Signed, Sterling BigHeath K. McCullough, MD [facility] Electronically Signed   By: Malachy MoanHeath  McCullough M.D.   On: 08/25/2015 10:54    EKG:   Orders placed or performed during the hospital encounter of 08/24/15  . EKG 12-Lead  . EKG 12-Lead      Management plans discussed with the patient, family and they are in agreement.  CODE STATUS:     Code Status Orders        Start     Ordered   08/25/15 0040  Full code   Continuous     08/25/15 0039    Advance Directive Documentation        Most Recent Value   Type of Advance Directive  Living will   Pre-existing out of facility DNR order (yellow form or pink MOST form)     "MOST" Form in Place?        TOTAL TIME TAKING CARE OF THIS PATIENT: 37 minutes.    Leandrew KoyanagiKALISETTI,Leyli Kevorkian M.D  on 08/25/2015 at 1:13 PM  Between 7am to 6pm - Pager - [phone]  After 6pm go to [email or web address] - password [email or web address] [facility]  [facility]  Office  [phone]  CC: Primary care physician; [facility]

## 2015-08-25 NOTE — Plan of Care (Signed)
Problem: Education: Goal: Knowledge of [facility] Education information/materials will improve Outcome: Progressing Stroke handout and education provided this shift.  Patient educated on s/s of stroke as well as risk factors.  Patient education provided on cognitive checks and symptoms to alert the nurse.  Problem: Safety: Goal: Ability to remain free from injury will improve Outcome: Progressing Patient is morbidly obese who is able to use a standard walker for short distances.  BSC provided.  Patient preference to sleep in chair instead of bed.  Feet elevated and chair alarm on throughout shift.  Call bell within reach and patient educated on alerting staff for any needs.  Hourly rounding to assess needs.    Problem: Self-Care: Goal: Ability to participate in self-care as condition permits will improve Outcome: Progressing Patient able to perform self-care but maximum assistance is needed to aid in [facility].  Patient unable to wipe himself or ascertain cleanliness after using BSC.

## 2015-08-25 NOTE — Progress Notes (Signed)
PT Cancellation Note  Patient Details Name: Tyrone BelfastMatthew Joseph Stehr Sr. MRN: [id number] DOB: 07/28/1941   Cancelled Treatment:    Reason Eval/Treat Not Completed: Patient at procedure or test/unavailable.  Will try later as time allows.   Ivar DrapeStout, Maricsa Sammons E 08/25/2015, 10:55 AM   Samul Dadauth Akita Maxim, PT MS Acute Rehab Dept. Number: [facility] [phone] and [phone]

## 2015-08-25 NOTE — Progress Notes (Signed)
Speech Therapy Note: received order, reviewed chart notes and consulted NSG re: pt's status this AM. NSG reported no overt deficits; pt was swallowing pills w/ water w/ NSG(while in room and drinking from the water jug) and communicating wants/needs appropriately. Upon meeting pt in room, pt stated same. He denied any s/s of dysphagia and was verbally communicating w/ NSG and SLP  As pt denies any ST needs at this time, ST services will sign off but be available for any further if pt's status changes. NSG agreed. Pt agreed.

## 2015-08-25 NOTE — Care Management Obs Status (Signed)
MEDICARE OBSERVATION STATUS NOTIFICATION   Patient Details  Name: Tyrone BelfastMatthew Joseph Bily Sr. MRN: [id number] Date of Birth: 05/06/1941   Medicare Observation Status Notification Given:  No (inhouse less than 24 hours)    Caren MacadamMichelle Shubh Chiara, RN 08/25/2015, 4:44 PM

## 2015-08-25 NOTE — Progress Notes (Signed)
Notified Dr. Nemiah CommanderKalisetti that patient only wants 1 unit of insulin instead of 2 units of insulin for FSBS of 183. Okay to only give 1 unit of insulin, per Dr. Nemiah CommanderKalisetti.

## 2015-08-25 NOTE — Progress Notes (Signed)
Patient discharged home per MD order. IV removed.  Discharge instructions, prescriptions, medicines, activity, follow up care, plan of care and diet given to patient and family. All questions answered. Patient verbalized understanding. Patient left via wheelchair with nursing staff and wife.

## 2015-08-25 NOTE — Evaluation (Signed)
Occupational Therapy Evaluation Patient Details Name: Tyrone Teegarden Sr. MRN: [id number] DOB: [birth year] Today's Date: 08/25/2015    History of Present Illness Patient reports he was at home and fell asleep in the chair, woke up to go to the bathroom and when he went to speak to his wife, his speech was garbled.  Reports he knew what he wanted to say but couldn't say it.  His wife called EMS and he was transported to [facility] for further evaluation.  He feels like he is pretty much back to his baseline but does feel slightly weak and somewhat lightheaded at times.  Denies any pain.   Clinical Impression   Patient was seen for OT evaluation this date, appears close to baseline for level of care.  He has had increasing difficulties with performing self care tasks at home due to his size and his wife has been helping him with bathing, lower body dressing and hygiene after BM.  He presents this date with mild muscle weakness in left shoulder compared to right and generalized weakness when performing self care tasks and functional mobility.  If he remains in acute care, he would likely benefit from 1-2 OT visits for strengthening of LUE as well as toilet transfers and functional mobility skills as it relates to self care tasks.  He would benefit from A/E instruction for lower body self care tasks to improve his independence at home.     Follow Up Recommendations  No OT follow up    Equipment Recommendations       Recommendations for Other Services       Precautions / Restrictions Precautions Precautions: Fall Restrictions Weight Bearing Restrictions: No      Mobility Bed Mobility                  Transfers Overall transfer level: Modified independent Equipment used: Standard walker             General transfer comment: sit to stand transfer with modified independence.  Toilet transfer with supervision due to patient reporting feeling light headed at times.     Balance                                            ADL Overall ADL's : Needs assistance/impaired Eating/Feeding: Independent   Grooming: Modified independent   Upper Body Bathing: Modified independent   Lower Body Bathing: Maximal assistance   Upper Body Dressing : Modified independent   Lower Body Dressing: Maximal assistance   Toilet Transfer: Supervision/safety   Toileting- Clothing Manipulation and Hygiene: Maximal assistance               Vision     Perception     Praxis      Pertinent Vitals/Pain Pain Assessment: No/denies pain     Hand Dominance Left   Extremity/Trunk Assessment Upper Extremity Assessment Upper Extremity Assessment: Overall WFL for tasks assessed (Patient with mild LUE weakness at the shoulder, 4-/5 strength for shoulder flexion.)   Lower Extremity Assessment Lower Extremity Assessment: Defer to PT evaluation       Communication Communication Communication: No difficulties   Cognition Arousal/Alertness: Awake/alert Behavior During Therapy: WFL for tasks assessed/performed Overall Cognitive Status: Within Functional Limits for tasks assessed                    Patient seen for  toileting skills this date, supervision for transfer to bariatric BSC, patient complaints of mild light headedness.  Requires assistance for hygiene.  General Comments       Exercises       Shoulder Instructions      Home Living Family/patient expects to be discharged to:: Private residence Living Arrangements: Spouse/significant other Available Help at Discharge: Family Type of Home: House Home Access: Stairs to enter Secretary/administratorntrance Stairs-Number of Steps: 3   Home Layout: One level     Bathroom Shower/Tub: Producer, television/film/videoWalk-in shower   Bathroom Toilet: Handicapped height Bathroom Accessibility: Yes How Accessible: Accessible via walker Home Equipment: Walker - 2 wheels;Shower seat;Bedside commode          Prior  Functioning/Environment Level of Independence: Needs assistance  Gait / Transfers Assistance Needed: ambulated with a walker for about 40-50 feet at home before fatigue.  ADL's / Homemaking Assistance Needed: Wife assisted patient with bathing, dressing lower body for sock, shoes and underwear, assistance for hygiene after BM.  Wife performs all household tasks.  Patient does not drive.         OT Diagnosis: Generalized weakness   OT Problem List: Decreased strength;Obesity   OT Treatment/Interventions: Self-care/ADL training;Therapeutic exercise;Patient/family education;DME and/or AE instruction    OT Goals(Current goals can be found in the care plan section) Acute Rehab OT Goals Patient Stated Goal: "i want to get back home today." OT Goal Formulation: With patient Time For Goal Achievement: 09/01/15 Potential to Achieve Goals: Good  OT Frequency: Min 1X/week   Barriers to D/C:            Co-evaluation              End of Session    Activity Tolerance: Patient tolerated treatment well Patient left: in chair;with call bell/phone within reach;with chair alarm set   Time: [id number] OT Time Calculation (min): 25 min Charges:  OT General Charges $OT Visit: 1 Procedure OT Evaluation $Initial OT Evaluation Tier I: 1 Procedure OT Treatments $Self Care/Home Management : 8-22 mins G-Codes: OT G-codes **NOT FOR INPATIENT CLASS** Functional Assessment Tool Used: clinical judgment, ADL assessment, strength testing Functional Limitation: Self care Self Care Current Status ([id number]): At least 40 percent but less than 60 percent impaired, limited or restricted Self Care Goal Status (N5621(G8988): At least 20 percent but less than 40 percent impaired, limited or restricted  Jadie Allington 08/25/2015, 11:35 AM

## 2015-08-25 NOTE — Consult Note (Signed)
CC: speech difficulty  HPI: Tyrone Garron Sr. is an 74 y.o. male presents with an episode of speech difficulty. Patient states that earlier last evening he was at home with his wife. This lasted for about 5 minutes, then the patient was slowly able to get order to out here or there.  Back to baseline. Pt has history of A-fib   Past Medical History  Diagnosis Date  . Diabetes mellitus without complication (HCC)   . Atrial fibrillation (HCC)   . GERD (gastroesophageal reflux disease)   . HTN (hypertension)   . Anxiety   . Depression   . PMR (polymyalgia rheumatica) (HCC)     History reviewed. No pertinent past surgical history.  Family History  Problem Relation Age of Onset  . Hypertension Father   . Diabetes Father   . Cancer Father   . Stroke Father   . Alcohol abuse Father   . Depression Sister   . Diabetes Sister   . Cancer Mother   . Stroke Mother     Social History:  reports that he has quit smoking. He has never used smokeless tobacco. He reports that he does not drink alcohol or use illicit drugs.  No Known Allergies  Medications: I have reviewed the patient's current medications.  ROS: History obtained from the patient  General ROS: negative for - chills, fatigue, fever, night sweats, weight gain or weight loss Psychological ROS: negative for - behavioral disorder, hallucinations, memory difficulties, mood swings or suicidal ideation Ophthalmic ROS: negative for - blurry vision, double vision, eye pain or loss of vision ENT ROS: negative for - epistaxis, nasal discharge, oral lesions, sore throat, tinnitus or vertigo Allergy and Immunology ROS: negative for - hives or itchy/watery eyes Hematological and Lymphatic ROS: negative for - bleeding problems, bruising or swollen lymph nodes Endocrine ROS: negative for - galactorrhea, hair pattern changes, polydipsia/polyuria or temperature intolerance Respiratory ROS: negative for - cough, hemoptysis,  shortness of breath or wheezing Cardiovascular ROS: negative for - chest pain, dyspnea on exertion, edema or irregular heartbeat Gastrointestinal ROS: negative for - abdominal pain, diarrhea, hematemesis, nausea/vomiting or stool incontinence Genito-Urinary ROS: negative for - dysuria, hematuria, incontinence or urinary frequency/urgency Musculoskeletal ROS: negative for - joint swelling or muscular weakness Neurological ROS: as noted in HPI Dermatological ROS: negative for rash and skin lesion changes  Physical Examination: Blood pressure 115/60, pulse 82, temperature 97.9 F (36.6 C), temperature source Oral, resp. rate 18, height  (1.854 m), weight 341 lb (154.677 kg), SpO2 98 %.   Neurological Examination Mental Status: Alert, oriented, thought content appropriate.  Speech fluent without evidence of aphasia.  Able to follow 3 step commands without difficulty. Cranial Nerves: II: Discs flat bilaterally; Visual fields grossly normal, pupils equal, round, reactive to light and accommodation III,IV, VI: ptosis not present, extra-ocular motions intact bilaterally V,VII: smile symmetric, facial light touch sensation normal bilaterally VIII: hearing normal bilaterally IX,X: gag reflex present XI: bilateral shoulder shrug XII: midline tongue extension Motor: Right : Upper extremity   5/5    Left:     Upper extremity   5/5  Lower extremity   5/5     Lower extremity   5/5 Tone and bulk:normal tone throughout; no atrophy noted Sensory: Pinprick and light touch intact throughout, bilaterally Deep Tendon Reflexes: 1+ and symmetric throughout Plantars: Right: downgoing   Left: downgoing Cerebellar: normal finger-to-nose, normal rapid alternating movements and normal heel-to-shin test Gait: not tested       Laboratory  Studies:   Basic Metabolic Panel:  Recent Labs Lab 08/24/15 2147  NA 142  K 4.1  CL 101  CO2 30  GLUCOSE 131*  BUN 20  CREATININE 0.80  CALCIUM 9.0     Liver Function Tests:  Recent Labs Lab 08/24/15 2147  AST 12*  ALT 10*  ALKPHOS 75  BILITOT 0.7  PROT 6.9  ALBUMIN 4.1   No results for input(s): LIPASE, AMYLASE in the last 168 hours. No results for input(s): AMMONIA in the last 168 hours.  CBC:  Recent Labs Lab 08/24/15 2147  WBC 10.1  HGB 14.5  HCT 45.0  MCV 85.6  PLT 251    Cardiac Enzymes:  Recent Labs Lab 08/24/15 2147  TROPONINI <0.03    BNP: Invalid input(s): POCBNP  CBG:  Recent Labs Lab 08/25/15 0819  GLUCAP 151*    Microbiology: No results found for this or any previous visit.  Coagulation Studies:  Recent Labs  08/24/15 2147  LABPROT 20.9*  INR 1.81    Urinalysis: No results for input(s): COLORURINE, LABSPEC, PHURINE, GLUCOSEU, HGBUR, BILIRUBINUR, KETONESUR, PROTEINUR, UROBILINOGEN, NITRITE, LEUKOCYTESUR in the last 168 hours.  Invalid input(s): APPERANCEUR  Lipid Panel:     Component Value Date/Time   CHOL 102 08/25/2015 0502   TRIG 135 08/25/2015 0502   HDL 30* 08/25/2015 0502   CHOLHDL 3.4 08/25/2015 0502   VLDL 27 08/25/2015 0502   LDLCALC 45 08/25/2015 0502    HgbA1C:  Lab Results  Component Value Date   HGBA1C 6.4* 04/24/2014    Urine Drug Screen:  No results found for: LABOPIA, COCAINSCRNUR, LABBENZ, AMPHETMU, THCU, LABBARB  Alcohol Level: No results for input(s): ETH in the last 168 hours.  Other results: EKG: afib.  Imaging: Ct Head Wo Contrast  08/24/2015  CLINICAL DATA:  Initial evaluation for acute aphasia, now resolved. EXAM: CT HEAD WITHOUT CONTRAST TECHNIQUE: Contiguous axial images were obtained from the base of the skull through the vertex without intravenous contrast. COMPARISON:  None. FINDINGS: Age appropriate cerebral atrophy present. Very mild chronic small vessel ischemic type changes within the periventricular white matter. Vascular calcifications within the carotid siphons. No acute large vessel territory infarct. No intracranial  hemorrhage. No mass lesion, midline shift, or mass effect. No hydrocephalus. No extra axial fluid collection. Scalp soft tissues within normal limits. No acute abnormality about the orbits. Partially visualized paranasal sinuses are clear. Visualized mastoid air cells clear. Calvarium intact.  Hyperostosis frontalis interna noted. IMPRESSION: 1. No acute intracranial process. 2. Mild age related cerebral atrophy with mild chronic small vessel ischemic disease. Electronically Signed   By: Rise MuBenjamin  McClintock M.D.   On: 08/24/2015 21:42   [place] Carotid Bilateral  08/25/2015  CLINICAL DATA:  74 year old male with symptoms of transient ischemic attack EXAM: BILATERAL CAROTID DUPLEX ULTRASOUND TECHNIQUE: Wallace CullensGray scale imaging, color Doppler and duplex ultrasound were performed of bilateral carotid and vertebral arteries in the neck. COMPARISON:  Head CT 08/24/2015 FINDINGS: Criteria: Quantification of carotid stenosis is based on velocity parameters that correlate the residual internal carotid diameter with NASCET-based stenosis levels, using the diameter of the distal internal carotid lumen as the denominator for stenosis measurement. The following velocity measurements were obtained: RIGHT ICA:  69/9 cm/sec CCA:  66/11 cm/sec SYSTOLIC ICA/CCA RATIO:  1.0 DIASTOLIC ICA/CCA RATIO:  0.8 ECA:  63 cm/sec LEFT ICA:  87/14 cm/sec CCA:  113/20 cm/sec SYSTOLIC ICA/CCA RATIO:  0.8 DIASTOLIC ICA/CCA RATIO:  0.7 ECA:  82 cm/sec RIGHT CAROTID ARTERY: Small focal heterogeneous  atherosclerotic plaque in the proximal internal carotid artery. By peak systolic velocity criteria the estimated stenosis remains less than 50%. RIGHT VERTEBRAL ARTERY:  Patent with antegrade flow. LEFT CAROTID ARTERY: Trace heterogeneous atherosclerotic plaque in the carotid bifurcation and proximal internal carotid artery. By peak systolic velocity criteria, the estimated stenosis remains less than 50%. LEFT VERTEBRAL ARTERY:  Patent with antegrade flow.  IMPRESSION: 1. 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 2. Mild (1-49%) stenosis proximal left internal carotid artery secondary to mild heterogeneous atherosclerotic plaque. 3. Vertebral arteries are patent with normal antegrade flow. Signed, Sterling Big, MD [facility] Electronically Signed   By: Malachy Moan M.D.   On: 08/25/2015 10:54     Assessment/Plan:   74 y.o. male presents with an episode of speech difficulty. Patient states that earlier last evening he was at home with his wife. This lasted for about 5 minutes, then the patient was slowly able to get order to out here or there.  Back to baseline. Pt has history of A-fib on xarelto only missed a dose last week  - Does not fit into MRI which will not change management - con't xarelto - d/c planning today    Pauletta Browns  08/25/2015, 11:15 AM

## 2015-08-28 LAB — GLUCOSE, CAPILLARY: GLUCOSE-CAPILLARY: 125 mg/dL — AB (ref 65–99)

## 2016-01-04 ENCOUNTER — Encounter: Payer: Self-pay | Admitting: Emergency Medicine

## 2016-01-04 ENCOUNTER — Emergency Department
Admission: EM | Admit: 2016-01-04 | Discharge: 2016-01-04 | Disposition: A | Discharge status: Home / Self Care | Payer: Medicare Other | Attending: Emergency Medicine | Admitting: Emergency Medicine

## 2016-01-04 DIAGNOSIS — I10 Essential (primary) hypertension: Secondary | ICD-10-CM | POA: Insufficient documentation

## 2016-01-04 DIAGNOSIS — Z7984 Long term (current) use of oral hypoglycemic drugs: Secondary | ICD-10-CM | POA: Diagnosis not present

## 2016-01-04 DIAGNOSIS — J449 Chronic obstructive pulmonary disease, unspecified: Secondary | ICD-10-CM | POA: Insufficient documentation

## 2016-01-04 DIAGNOSIS — E119 Type 2 diabetes mellitus without complications: Secondary | ICD-10-CM | POA: Diagnosis not present

## 2016-01-04 DIAGNOSIS — I482 Chronic atrial fibrillation: Secondary | ICD-10-CM | POA: Insufficient documentation

## 2016-01-04 DIAGNOSIS — Z87891 Personal history of nicotine dependence: Secondary | ICD-10-CM | POA: Insufficient documentation

## 2016-01-04 DIAGNOSIS — Z8673 Personal history of transient ischemic attack (TIA), and cerebral infarction without residual deficits: Secondary | ICD-10-CM | POA: Insufficient documentation

## 2016-01-04 DIAGNOSIS — H9192 Unspecified hearing loss, left ear: Secondary | ICD-10-CM | POA: Diagnosis present

## 2016-01-04 DIAGNOSIS — E785 Hyperlipidemia, unspecified: Secondary | ICD-10-CM | POA: Insufficient documentation

## 2016-01-04 DIAGNOSIS — H6122 Impacted cerumen, left ear: Secondary | ICD-10-CM | POA: Insufficient documentation

## 2016-01-04 DIAGNOSIS — F329 Major depressive disorder, single episode, unspecified: Secondary | ICD-10-CM | POA: Diagnosis not present

## 2016-01-04 HISTORY — DX: Chronic obstructive pulmonary disease, unspecified: J44.9

## 2016-01-04 LAB — COMPREHENSIVE METABOLIC PANEL
ALT: 11 U/L — ABNORMAL LOW (ref 17–63)
AST: 17 U/L (ref 15–41)
Albumin: 3.8 g/dL (ref 3.5–5.0)
Alkaline Phosphatase: 89 U/L (ref 38–126)
Anion gap: 12 (ref 5–15)
BUN: 20 mg/dL (ref 6–20)
CHLORIDE: 104 mmol/L (ref 101–111)
CO2: 28 mmol/L (ref 22–32)
Calcium: 8.9 mg/dL (ref 8.9–10.3)
Creatinine, Ser: 0.73 mg/dL (ref 0.61–1.24)
Glucose, Bld: 207 mg/dL — ABNORMAL HIGH (ref 65–99)
POTASSIUM: 4.7 mmol/L (ref 3.5–5.1)
Sodium: 144 mmol/L (ref 135–145)
Total Bilirubin: 0.8 mg/dL (ref 0.3–1.2)
Total Protein: 6.6 g/dL (ref 6.5–8.1)

## 2016-01-04 LAB — CBC WITH DIFFERENTIAL/PLATELET
BASOS ABS: 0.1 10*3/uL (ref 0–0.1)
EOS ABS: 0.2 10*3/uL (ref 0–0.7)
Eosinophils Relative: 2 %
HCT: 43.9 % (ref 40.0–52.0)
HEMOGLOBIN: 14.2 g/dL (ref 13.0–18.0)
Lymphocytes Relative: 14 %
Lymphs Abs: 1.7 10*3/uL (ref 1.0–3.6)
MCH: 27.7 pg (ref 26.0–34.0)
MCHC: 32.3 g/dL (ref 32.0–36.0)
MCV: 85.8 fL (ref 80.0–100.0)
Monocytes Absolute: 0.5 10*3/uL (ref 0.2–1.0)
Monocytes Relative: 4 %
Neutro Abs: 9.8 10*3/uL — ABNORMAL HIGH (ref 1.4–6.5)
PLATELETS: 233 10*3/uL (ref 150–440)
RBC: 5.11 MIL/uL (ref 4.40–5.90)
RDW: 15.3 % — AB (ref 11.5–14.5)
WBC: 12.3 10*3/uL — AB (ref 3.8–10.6)

## 2016-01-04 LAB — APTT: APTT: 36 s (ref 24–36)

## 2016-01-04 LAB — PROTIME-INR
INR: 1.28
PROTHROMBIN TIME: 16.1 s — AB (ref 11.4–15.0)

## 2016-01-04 MED ORDER — CARBAMIDE PEROXIDE 6.5 % OT SOLN
3.0000 [drp] | Freq: Two times a day (BID) | OTIC | Status: AC
Start: 2016-01-04 — End: 2017-01-03

## 2016-01-04 MED ORDER — AMOXICILLIN 500 MG PO CAPS: 500.0000 mg | ORAL_CAPSULE | Freq: Two times a day (BID) | ORAL | Status: AC

## 2016-01-04 NOTE — ED Provider Notes (Signed)
[facility] Emergency Department Provider Note  ____________________________________________    I have reviewed the triage vital signs and the nursing notes.   HISTORY  Chief Complaint Hearing Loss    HPI Tyrone Hilgers Sr. is a 75 y.o. male who presents with complaints of left-sided decreased hearing. Patient reports the symptoms seemed to start yesterday but when he woke up this morning he could barely hear out of his left ear. He is concerned because he has had a TIA in the past. He does report that yesterday his hearing felt muffled in both ears. He has some discomfort in both ears. No fevers or chills. No recent travel. No neuro deficits     Past Medical History  Diagnosis Date  . Diabetes mellitus without complication (HCC)   . Atrial fibrillation (HCC)   . GERD (gastroesophageal reflux disease)   . HTN (hypertension)   . Anxiety   . Depression   . PMR (polymyalgia rheumatica) (HCC)   . COPD (chronic obstructive pulmonary disease) [facility])     Patient Active Problem List   Diagnosis Date Noted  . Pressure ulcer 08/25/2015  . TIA (transient ischemic attack) 08/24/2015  . Type 2 diabetes mellitus (HCC) 08/24/2015  . Chronic a-fib (HCC) 08/24/2015  . Anxiety 08/24/2015  . HTN (hypertension) 08/24/2015  . HLD (hyperlipidemia) 08/24/2015  . PMR (polymyalgia rheumatica) (HCC) 08/24/2015    History reviewed. No pertinent past surgical history.  Current Outpatient Rx  Name  Route  Sig  Dispense  Refill  . ALPRAZolam (XANAX) 1 MG tablet   Oral   Take 1 mg by mouth 4 (four) times daily as needed for anxiety.         Marland Kitchen amoxicillin (AMOXIL) 500 MG capsule   Oral   Take 1 capsule (500 mg total) by mouth 2 (two) times daily.   20 capsule   0   . busPIRone (BUSPAR) 10 MG tablet   Oral   Take 5 mg by mouth 2 (two) times daily.          . carbamide peroxide (DEBROX) 6.5 % otic solution   Both Ears   Place 3 drops into both ears 2  (two) times daily.   15 mL   2   . furosemide (LASIX) 40 MG tablet   Oral   Take 40 mg by mouth daily as needed for fluid.          Marland Kitchen glimepiride (AMARYL) 4 MG tablet   Oral   Take 4 mg by mouth 2 (two) times daily as needed.          . metFORMIN (GLUCOPHAGE) 1000 MG tablet   Oral   Take 1 tablet (1,000 mg total) by mouth 2 (two) times daily with a meal.   60 tablet   2   . metoprolol succinate (TOPROL-XL) 50 MG 24 hr tablet   Oral   Take 1 tablet (50 mg total) by mouth daily. HOLD MEDICINE IF BLOOD PRESSURE IS LOW, LIKE IF THE TOP (SYSTOLIC) IS <100 OR BOTTOM (DIASTOLIC)< 55   30 tablet   0   . mirtazapine (REMERON) 30 MG tablet   Oral   Take 30 mg by mouth at bedtime. Take 2 tablets.         . predniSONE (DELTASONE) 5 MG tablet   Oral   Take 5 mg by mouth daily with breakfast.         . rivaroxaban (XARELTO) 20 MG TABS tablet  Oral   Take 20 mg by mouth daily with supper.         . rosuvastatin (CRESTOR) 10 MG tablet   Oral   Take 1 tablet by mouth daily.      3   . tamsulosin (FLOMAX) 0.4 MG CAPS capsule   Oral   Take 2 capsules by mouth daily.           Allergies Review of patient's allergies indicates no known allergies.  Family History  Problem Relation Age of Onset  . Hypertension Father   . Diabetes Father   . Cancer Father   . Stroke Father   . Alcohol abuse Father   . Depression Sister   . Diabetes Sister   . Cancer Mother   . Stroke Mother     Social History Social History  Substance Use Topics  . Smoking status: Former Games developermoker  . Smokeless tobacco: Never Used  . Alcohol Use: No    Review of Systems  Constitutional: Negative for fever. Eyes: Negative for redness ENT: As above for ear issues Cardiovascular: Negative for chest pain Respiratory: Negative for cough Gastrointestinal: Negative for nausea or vomiting  Musculoskeletal: Negative for weakness Skin: Negative for rash. Neurological: Negative for  headache Psychiatric: Mild anxiety    ____________________________________________   PHYSICAL EXAM:  VITAL SIGNS: ED Triage Vitals  Enc Vitals Group     BP 01/04/16 1251 112/76 mmHg     Pulse Rate 01/04/16 1251 87     Resp 01/04/16 1251 20     Temp 01/04/16 1251 97.6 F (36.4 C)     Temp Source 01/04/16 1251 Oral     SpO2 01/04/16 1251 91 %     Weight 01/04/16 1251 340 lb (154.223 kg)     Height 01/04/16 1251 6\' 1"  (1.854 m)     Head Cir --      Peak Flow --      Pain Score 01/04/16 1307 0     Pain Loc --      Pain Edu? --      Excl. in GC? --     Constitutional: Alert and oriented. Well appearing and in no distress.  Eyes: Conjunctivae are normal. No erythema or injection ENT   Head: Normocephalic and atraumatic.   Mouth/Throat: Mucous membranes are moist.  Right ear: External canal clear, cloudy TM with mild bulging, left ear: Cerumen impaction Respiratory: Normal respiratory effort without tachypnea nor retractions.  Gastrointestinal: Soft and non-tender in all quadrants. No distention. There is no CVA tenderness. Genitourinary: deferred Musculoskeletal: Nontender with normal range of motion in all extremities. No lower extremity tenderness nor edema. Neurologic:  Normal speech and language. No gross focal neurologic deficits are appreciated. Skin:  Skin is warm, dry and intact. No rash noted. Psychiatric: Mood and affect are normal. Patient exhibits appropriate insight and judgment.  ____________________________________________    LABS (pertinent positives/negatives)  Labs Reviewed  CBC WITH DIFFERENTIAL/PLATELET - Abnormal; Notable for the following:    WBC 12.3 (*)    RDW 15.3 (*)    Neutro Abs 9.8 (*)    All other components within normal limits  COMPREHENSIVE METABOLIC PANEL - Abnormal; Notable for the following:    Glucose, Bld 207 (*)    ALT 11 (*)    All other components within normal limits  PROTIME-INR - Abnormal; Notable for the  following:    Prothrombin Time 16.1 (*)    All other components within normal limits  APTT  ____________________________________________   EKG  ED ECG REPORT I, Jene Every, the attending physician, personally viewed and interpreted this ECG.   Date: 01/04/2016  EKG Time: 1:15 PM  Rate: 93  Rhythm: atrial fibrillation, rate 93  Axis: Normal    ST&T Change: Nonspecific   ____________________________________________    RADIOLOGY   ____________________________________________   PROCEDURES  Procedure(s) performed: yes  Ceruminosis is noted.  Wax is removed by curette. Instructions for home care to prevent wax buildup are given. Patient tolerated well. Left TM is bulging and intact   Critical Care performed: none  ____________________________________________   INITIAL IMPRESSION / ASSESSMENT AND PLAN / ED COURSE  Pertinent labs & imaging results that were available during my care of the patient were reviewed by me and considered in my medical decision making (see chart for details).  Patient had improvement in hearing after cerumen disimpaction. He does have some bulging of his left TM and right TM, possibility of otitis also causing decreased hearing. We will treat with antibiotics and follow up with ENT for further evaluation. Not consistent with TIA or CVA, patient is greatly relieved by this  ____________________________________________   FINAL CLINICAL IMPRESSION(S) / ED DIAGNOSES  Final diagnoses:  Hearing loss due to cerumen impaction, left          Jene Every, MD 01/04/16 1659

## 2016-01-04 NOTE — Discharge Instructions (Signed)
Cerumen Impaction The structures of the external ear canal secrete a waxy substance known as cerumen. Excess cerumen can build up in the ear canal, causing a condition known as cerumen impaction. Cerumen impaction can cause ear pain and disrupt the function of the ear. The rate of cerumen production differs for each individual. In certain individuals, the configuration of the ear canal may decrease his or her ability to naturally remove cerumen. CAUSES Cerumen impaction is caused by excessive cerumen production or buildup. RISK FACTORS  Frequent use of swabs to clean ears.  Having narrow ear canals.  Having eczema.  Being dehydrated. SIGNS AND SYMPTOMS  Diminished hearing.  Ear drainage.  Ear pain.  Ear itch. TREATMENT Treatment may involve:  Over-the-counter or prescription ear drops to soften the cerumen.  Removal of cerumen by a health care provider. This may be done with:  Irrigation with warm water. This is the most common method of removal.  Ear curettes and other instruments.  Surgery. This may be done in severe cases. HOME CARE INSTRUCTIONS  Take medicines only as directed by your health care provider.  Do not insert objects into the ear with the intent of cleaning the ear. PREVENTION  Do not insert objects into the ear, even with the intent of cleaning the ear. Removing cerumen as a part of normal hygiene is not necessary, and the use of swabs in the ear canal is not recommended.  Drink enough water to keep your urine clear or pale yellow.  Control your eczema if you have it. SEEK MEDICAL CARE IF:  You develop ear pain.  You develop bleeding from the ear.  The cerumen does not clear after you use ear drops as directed.   This information is not intended to replace advice given to you by your health care provider. Make sure you discuss any questions you have with your health care provider.   Document Released: 09/25/2004 Document Revised: 09/08/2014  Document Reviewed: 04/04/2015 Elsevier Interactive Patient Education 2016 [employer]  

## 2016-01-04 NOTE — ED Notes (Signed)
C/O hearing loss, onset of symptoms yesterday around 1700.  Initially c/o muffled sounds to right ear and then this morning states cannot hear anything out of left ear.

## 2016-05-26 ENCOUNTER — Emergency Department
Admission: EM | Admit: 2016-05-26 | Discharge: 2016-05-26 | Disposition: A | Discharge status: Home / Self Care | Payer: Medicare Other | Attending: Emergency Medicine | Admitting: Emergency Medicine

## 2016-05-26 ENCOUNTER — Emergency Department: Payer: Medicare Other

## 2016-05-26 DIAGNOSIS — J449 Chronic obstructive pulmonary disease, unspecified: Secondary | ICD-10-CM | POA: Insufficient documentation

## 2016-05-26 DIAGNOSIS — J90 Pleural effusion, not elsewhere classified: Secondary | ICD-10-CM | POA: Insufficient documentation

## 2016-05-26 DIAGNOSIS — Z7952 Long term (current) use of systemic steroids: Secondary | ICD-10-CM | POA: Insufficient documentation

## 2016-05-26 DIAGNOSIS — I110 Hypertensive heart disease with heart failure: Secondary | ICD-10-CM | POA: Diagnosis not present

## 2016-05-26 DIAGNOSIS — I509 Heart failure, unspecified: Secondary | ICD-10-CM | POA: Diagnosis not present

## 2016-05-26 DIAGNOSIS — I959 Hypotension, unspecified: Secondary | ICD-10-CM | POA: Diagnosis not present

## 2016-05-26 DIAGNOSIS — Z7984 Long term (current) use of oral hypoglycemic drugs: Secondary | ICD-10-CM | POA: Diagnosis not present

## 2016-05-26 DIAGNOSIS — Z87891 Personal history of nicotine dependence: Secondary | ICD-10-CM | POA: Insufficient documentation

## 2016-05-26 DIAGNOSIS — E119 Type 2 diabetes mellitus without complications: Secondary | ICD-10-CM | POA: Insufficient documentation

## 2016-05-26 DIAGNOSIS — Z79899 Other long term (current) drug therapy: Secondary | ICD-10-CM | POA: Diagnosis not present

## 2016-05-26 DIAGNOSIS — R0602 Shortness of breath: Secondary | ICD-10-CM | POA: Diagnosis present

## 2016-05-26 LAB — BASIC METABOLIC PANEL
Anion gap: 8 (ref 5–15)
BUN: 17 mg/dL (ref 6–20)
CALCIUM: 8.7 mg/dL — AB (ref 8.9–10.3)
CO2: 38 mmol/L — ABNORMAL HIGH (ref 22–32)
CREATININE: 0.62 mg/dL (ref 0.61–1.24)
Chloride: 95 mmol/L — ABNORMAL LOW (ref 101–111)
Glucose, Bld: 135 mg/dL — ABNORMAL HIGH (ref 65–99)
Potassium: 4 mmol/L (ref 3.5–5.1)
SODIUM: 141 mmol/L (ref 135–145)

## 2016-05-26 LAB — CBC WITH DIFFERENTIAL/PLATELET
BASOS ABS: 0.1 10*3/uL (ref 0–0.1)
BASOS PCT: 1 %
EOS PCT: 1 %
Eosinophils Absolute: 0.1 10*3/uL (ref 0–0.7)
HCT: 40.3 % (ref 40.0–52.0)
Hemoglobin: 13.5 g/dL (ref 13.0–18.0)
LYMPHS PCT: 13 %
Lymphs Abs: 1.4 10*3/uL (ref 1.0–3.6)
MCH: 28.8 pg (ref 26.0–34.0)
MCHC: 33.5 g/dL (ref 32.0–36.0)
MCV: 86.1 fL (ref 80.0–100.0)
MONO ABS: 0.7 10*3/uL (ref 0.2–1.0)
Monocytes Relative: 7 %
Neutro Abs: 8.4 10*3/uL — ABNORMAL HIGH (ref 1.4–6.5)
Neutrophils Relative %: 78 %
PLATELETS: 241 10*3/uL (ref 150–440)
RBC: 4.68 MIL/uL (ref 4.40–5.90)
RDW: 15 % — AB (ref 11.5–14.5)
WBC: 10.7 10*3/uL — AB (ref 3.8–10.6)

## 2016-05-26 LAB — BRAIN NATRIURETIC PEPTIDE: B NATRIURETIC PEPTIDE 5: 62 pg/mL (ref 0.0–100.0)

## 2016-05-26 LAB — TROPONIN I

## 2016-05-26 NOTE — ED Provider Notes (Signed)
[facility] Emergency Department Provider Note   ____________________________________________   First MD Initiated Contact with Patient 05/26/16 1353     (approximate)  I have reviewed the triage vital signs and the nursing notes.   HISTORY  Chief Complaint Shortness of Breath   HPI Tyrone BelfastMatthew Joseph Avans Sr. is a 75 y.o. male with a history of atrial fibrillation on Xarelto as well as morbid obesity who is presenting to the emergency department today with shortness of breath. He says that the shortness of breath has been worsening over the past week. He denies any pain. Says he is only able to take several steps without becoming acutely short of breath. He says that he has Lasix as needed and is used a dose 2 days ago but without any improvement. Says that he wears home oxygen 2 L and as needed. Up to 3 and half liters because of worsening shortness of breath and desaturations down to the 80s on his pulse ox at home.   Past Medical History:  Diagnosis Date  . Anxiety   . Atrial fibrillation (HCC)   . COPD (chronic obstructive pulmonary disease) (HCC)   . Depression   . Diabetes mellitus without complication (HCC)   . GERD (gastroesophageal reflux disease)   . HTN (hypertension)   . PMR (polymyalgia rheumatica) [facility](HCC)     Patient Active Problem List   Diagnosis Date Noted  . Pressure ulcer 08/25/2015  . TIA (transient ischemic attack) 08/24/2015  . Type 2 diabetes mellitus (HCC) 08/24/2015  . Chronic a-fib (HCC) 08/24/2015  . Anxiety 08/24/2015  . HTN (hypertension) 08/24/2015  . HLD (hyperlipidemia) 08/24/2015  . PMR (polymyalgia rheumatica) (HCC) 08/24/2015    History reviewed. No pertinent surgical history.  Prior to Admission medications   Medication Sig Start Date End Date Taking? Authorizing Provider  ALPRAZolam Prudy Feeler(XANAX) 1 MG tablet Take 1 mg by mouth 4 (four) times daily as needed for anxiety.    Historical Provider, MD  amoxicillin  (AMOXIL) 500 MG capsule Take 1 capsule (500 mg total) by mouth 2 (two) times daily. 01/04/16   Jene Everyobert Kinner, MD  busPIRone (BUSPAR) 10 MG tablet Take 5 mg by mouth 2 (two) times daily.     Historical Provider, MD  carbamide peroxide (DEBROX) 6.5 % otic solution Place 3 drops into both ears 2 (two) times daily. 01/04/16 01/03/17  Jene Everyobert Kinner, MD  furosemide (LASIX) 40 MG tablet Take 40 mg by mouth daily as needed for fluid.     Historical Provider, MD  glimepiride (AMARYL) 4 MG tablet Take 4 mg by mouth 2 (two) times daily as needed.     Historical Provider, MD  metFORMIN (GLUCOPHAGE) 1000 MG tablet Take 1 tablet (1,000 mg total) by mouth 2 (two) times daily with a meal. 08/25/15   Enid Baasadhika Kalisetti, MD  metoprolol succinate (TOPROL-XL) 50 MG 24 hr tablet Take 1 tablet (50 mg total) by mouth daily. HOLD MEDICINE IF BLOOD PRESSURE IS LOW, LIKE IF THE TOP (SYSTOLIC) IS <100 OR BOTTOM (DIASTOLIC)< 55 08/25/15   Enid Baasadhika Kalisetti, MD  mirtazapine (REMERON) 30 MG tablet Take 30 mg by mouth at bedtime. Take 2 tablets.    Historical Provider, MD  predniSONE (DELTASONE) 5 MG tablet Take 5 mg by mouth daily with breakfast.    Historical Provider, MD  rivaroxaban (XARELTO) 20 MG TABS tablet Take 20 mg by mouth daily with supper.    Historical Provider, MD  rosuvastatin (CRESTOR) 10 MG tablet Take 1 tablet by  mouth daily. 07/05/15   Historical Provider, MD  tamsulosin (FLOMAX) 0.4 MG CAPS capsule Take 2 capsules by mouth daily. 08/18/15   Historical Provider, MD    Allergies Review of patient's allergies indicates no known allergies.  Family History  Problem Relation Age of Onset  . Hypertension Father   . Diabetes Father   . Cancer Father   . Stroke Father   . Alcohol abuse Father   . Depression Sister   . Diabetes Sister   . Cancer Mother   . Stroke Mother     Social History Social History  Substance Use Topics  . Smoking status: Former Games developer  . Smokeless tobacco: Never Used  . Alcohol use No      Review of Systems Constitutional: No fever/chills Eyes: No visual changes. ENT: No sore throat. Cardiovascular: Denies chest pain. Respiratory: As above Gastrointestinal: No abdominal pain.  No nausea, no vomiting.  No diarrhea.  No constipation. Genitourinary: Negative for dysuria. Musculoskeletal: Negative for back pain. Skin: Negative for rash. Neurological: Negative for headaches, focal weakness or numbness.  10-point ROS otherwise negative.  ____________________________________________   PHYSICAL EXAM:  VITAL SIGNS: ED Triage Vitals  Enc Vitals Group     BP      Pulse      Resp      Temp      Temp src      SpO2      Weight      Height      Head Circumference      Peak Flow      Pain Score      Pain Loc      Pain Edu?      Excl. in GC?     Constitutional: Alert and oriented. Morbidly obese. Wearing nasal cannula oxygen Eyes: Conjunctivae are normal. PERRL. EOMI. Head: Atraumatic. Nose: No congestion/rhinnorhea. Mouth/Throat: Mucous membranes are moist.   Neck: No stridor.   Cardiovascular: Normal rate, regular rhythm. Grossly normal heart sounds.  Respiratory: Normal respiratory effort.  No retractions. Rales to the bilateral bases up to the mid fields. Gastrointestinal: Soft and nontender. No distention.  Musculoskeletal:  Mild to moderate bilateral lower extremity edema. No tenderness palpation. Neurologic:  Normal speech and language. No gross focal neurologic deficits are appreciated.  Skin:  Skin is warm, dry and intact. No rash noted. Psychiatric: Mood and affect are normal. Speech and behavior are normal.  ____________________________________________   LABS (all labs ordered are listed, but only abnormal results are displayed)  Labs Reviewed  CBC WITH DIFFERENTIAL/PLATELET - Abnormal; Notable for the following:       Result Value   WBC 10.7 (*)    RDW 15.0 (*)    Neutro Abs 8.4 (*)    All other components within normal limits  BASIC  METABOLIC PANEL - Abnormal; Notable for the following:    Chloride 95 (*)    CO2 38 (*)    Glucose, Bld 135 (*)    Calcium 8.7 (*)    All other components within normal limits  TROPONIN I  BRAIN NATRIURETIC PEPTIDE   ____________________________________________  EKG  ED ECG REPORT I, Arelia Longest, the attending physician, personally viewed and interpreted this ECG.   Date: 05/26/2016  EKG Time: 1404  Rate: 86  Rhythm: atrial fibrillation, rate 86  Axis: Normal axis  Intervals:none  ST&T Change: No ST segment elevation or depression. No abnormal T-wave inversion.  ____________________________________________  RADIOLOGY DG Chest 2 View (Accession [id number]) (  Order [id number])  Imaging  Date: 05/26/2016 Department: [facility] EMERGENCY DEPARTMENT Released By/Authorizing: Myrna Blazer, MD (auto-released)  PACS Images   Show images for DG Chest 2 View  Study Result   CLINICAL DATA:  Shortness of breath.  Home oxygen.  EXAM: CHEST  2 VIEW  COMPARISON:  04/24/2014 CT chest  FINDINGS: Blunting of both costophrenic angles with bandlike densities along the hemidiaphragms favoring atelectasis over pneumonia. There is some fluid in the minor fissure. Mild enlargement of the cardiopericardial silhouette. Chronic bony deformity of the left lower scapula.  IMPRESSION: 1. Small bilateral pleural effusions with suspected atelectasis along the hemidiaphragms. Trace fluid in the right minor fissure. 2. Mild enlargement of the cardiopericardial silhouette.   Electronically Signed   By: Gaylyn Rong M.D.   On: 05/26/2016 14:31      ____________________________________________   PROCEDURES  Procedure(s) performed:   Procedures  Critical Care performed:   ____________________________________________   INITIAL IMPRESSION / ASSESSMENT AND PLAN / ED COURSE  Pertinent labs & imaging results that were available  during my care of the patient were reviewed by me and considered in my medical decision making (see chart for details).  ----------------------------------------- 3:24 PM on 05/26/2016 -----------------------------------------  Patient found to be hypotensive with systolic in the 90s. The patient says that his baseline is usually about 110. Also found to have pleural effusions. Plan for admission to the hospital. Likely with need of further cardiac workup and likely medication changes. Informed the patient as well as family the need for admission. Signed out to Dr. Cherlynn Kaiser.   Clinical Course     ____________________________________________   FINAL CLINICAL IMPRESSION(S) / ED DIAGNOSES  CHF. Hypotension.    NEW MEDICATIONS STARTED DURING THIS VISIT:  New Prescriptions   No medications on file     Note:  This document was prepared using Dragon voice recognition software and may include unintentional dictation errors.    Myrna Blazer, MD 05/26/16 [phone]

## 2016-05-26 NOTE — ED Notes (Signed)
Pt called out stating he needed the paper taken out of his bottom. [facility] not [facility] any paper but said area is red and has pressure ulcer. Mardelle MatteAndy, ED medic informed this RN of pressure ulcer on bottom. Dr. Pershing ProudSchaevitz saw as well. Barrier cream placed on.

## 2016-05-26 NOTE — ED Provider Notes (Signed)
Patient seen by Dr. Langston MaskerShaevitz and Cherlynn KaiserSainani, I had no contact with the patient. Dr. Cherlynn KaiserSainani asked me to print DC instructions as he does not know how to   Tyrone Everyobert Jamarl Pew, MD 05/26/16 [phone]

## 2016-05-26 NOTE — ED Triage Notes (Signed)
Pt presents to ED via [facility] from home. Pt states that he has had growing SOB over the last week. States the last week he has had to be on 02 at home, but before that he was not on 02.  States now he has to be on it 24 hours a day. States he would get SOB while walking, but SOB has been more constant. Uses a walker to walk at home. States 02 at home has been in 80's so his doctor told him to start on 2L nasal cannula. Pt was 91% RA, placed on 2L nasal cannula.

## 2016-05-26 NOTE — ED Notes (Signed)
Pt transported to xray via stretcher

## 2016-05-26 NOTE — Consult Note (Signed)
[employer] - [place] [facility]   PATIENT NAME: Tyrone Ruiz    MR#:  [id number]  DATE OF BIRTH:  02-18-1941  DATE OF CONSULT:  05/26/2016  PRIMARY CARE PHYSICIAN: [facility]   REQUESTING/REFERRING PHYSICIAN: Dr. Gladstone Pih  CHIEF COMPLAINT:   Chief Complaint  Patient presents with  . Shortness of Breath  Weakness  HISTORY OF PRESENT ILLNESS:  Tyrone Ruiz  is a 75 y.o. male with a known history of Chronic atrial fibrillation, COPD, obesity pickwickian syndrome, anxiety, diabetes, GERD, hypertension, polymyalgia rheumatica who presented to the hospital due to shortness of breath and weakness. Patient says that he is on chronic oxygen at home at around 2 L and his sats are usually in the low 90s. Over the past few days he had to turn up his oxygen to 3 L as his O2 sats would drop into the mid 80s. He also has been complaining of some shortness of breath at rest and at minimal exertion. He presented to the ER after he called his cardiologist and advised him to come to the hospital given his hypoxia. In the emergency room patient had a chest x-ray which showed small bilateral pleural effusions and he was noted to be mildly hypoxic. He was placed on some oxygen and currently at 2 L is satting anywhere from 97% 100%. He is currently not complaining of any shortness of breath, no chest pain, nausea, vomiting, abdominal pain or any other associated symptoms. He does admit to about a 10 to poor weight gain breath over the past 6 months. He denies any paroxysmal nocturnal dyspnea or orthopnea. He normally sleeps in a recliner. Hospitalist services were contacted for possible admission.  PAST MEDICAL HISTORY:   Past Medical History:  Diagnosis Date  . Anxiety   . Atrial fibrillation (HCC)   . COPD (chronic obstructive pulmonary disease) (HCC)   . Depression   . Diabetes mellitus without complication (HCC)   . GERD (gastroesophageal reflux disease)   .  HTN (hypertension)   . PMR (polymyalgia rheumatica) (HCC)     PAST SURGICAL HISTOIRY:  History reviewed. No pertinent surgical history.  SOCIAL HISTORY:   Social History  Substance Use Topics  . Smoking status: Former Games developer  . Smokeless tobacco: Never Used  . Alcohol use No    FAMILY HISTORY:   Family History  Problem Relation Age of Onset  . Hypertension Father   . Diabetes Father   . Cancer Father   . Stroke Father   . Alcohol abuse Father   . Depression Sister   . Diabetes Sister   . Cancer Mother   . Stroke Mother     DRUG ALLERGIES:  No Known Allergies  REVIEW OF SYSTEMS:   Review of Systems  Constitutional: Negative for fever and weight loss.  HENT: Negative for congestion, nosebleeds and tinnitus.   Eyes: Negative for blurred vision, double vision and redness.  Respiratory: Positive for shortness of breath. Negative for cough and hemoptysis.   Cardiovascular: Negative for chest pain, orthopnea, leg swelling and PND.  Gastrointestinal: Negative for abdominal pain, diarrhea, melena, nausea and vomiting.  Genitourinary: Negative for dysuria, hematuria and urgency.  Musculoskeletal: Negative for falls and joint pain.  Neurological: Positive for weakness. Negative for dizziness, tingling, sensory change, focal weakness, seizures and headaches.  Endo/Heme/Allergies: Negative for polydipsia. Does not bruise/bleed easily.  Psychiatric/Behavioral: Negative for depression and memory loss. The patient is not nervous/anxious.      MEDICATIONS AT  HOME:   Prior to Admission medications   Medication Sig Start Date End Date Taking? Authorizing Provider  ALPRAZolam Prudy Feeler) 1 MG tablet Take 1 mg by mouth 4 (four) times daily as needed for anxiety.   Yes Historical Provider, MD  furosemide (LASIX) 40 MG tablet Take 40 mg by mouth daily as needed for fluid.    Yes Historical Provider, MD  glimepiride (AMARYL) 4 MG tablet Take 2 mg by mouth daily with breakfast.    Yes  Historical Provider, MD  metFORMIN (GLUCOPHAGE) 1000 MG tablet Take 1 tablet (1,000 mg total) by mouth 2 (two) times daily with a meal. 08/25/15  Yes Enid Baas, MD  metoprolol succinate (TOPROL-XL) 50 MG 24 hr tablet Take 1 tablet (50 mg total) by mouth daily. HOLD MEDICINE IF BLOOD PRESSURE IS LOW, LIKE IF THE TOP (SYSTOLIC) IS <100 OR BOTTOM (DIASTOLIC)< 55 Patient taking differently: Take 50-100 mg by mouth 2 (two) times daily. Take 100mg  in morning and take 50 mg in afternoon   HOLD MEDICINE IF BLOOD PRESSURE IS LOW, LIKE IF THE TOP (SYSTOLIC) IS <100 OR BOTTOM (DIASTOLIC)< 55 08/25/15  Yes Enid Baas, MD  mirtazapine (REMERON) 30 MG tablet Take 30 mg by mouth at bedtime.    Yes Historical Provider, MD  predniSONE (DELTASONE) 5 MG tablet Take 5 mg by mouth daily with breakfast.   Yes Historical Provider, MD  rivaroxaban (XARELTO) 20 MG TABS tablet Take 20 mg by mouth daily with supper.   Yes Historical Provider, MD  rosuvastatin (CRESTOR) 10 MG tablet Take 1 tablet by mouth daily. 07/05/15  Yes Historical Provider, MD  tamsulosin (FLOMAX) 0.4 MG CAPS capsule Take 2 capsules by mouth daily. 08/18/15  Yes Historical Provider, MD  amoxicillin (AMOXIL) 500 MG capsule Take 1 capsule (500 mg total) by mouth 2 (two) times daily. Patient not taking: Reported on 05/26/2016 01/04/16   Jene Every, MD  busPIRone (BUSPAR) 10 MG tablet Take 5 mg by mouth 2 (two) times daily.     Historical Provider, MD  carbamide peroxide (DEBROX) 6.5 % otic solution Place 3 drops into both ears 2 (two) times daily. Patient not taking: Reported on 05/26/2016 01/04/16 01/03/17  Jene Every, MD      VITAL SIGNS:  Blood pressure 112/61, pulse 98, temperature 97.7 F (36.5 C), temperature source Oral, resp. rate (!) 21, height 6\' 1"  (1.854 m), weight (!) 163.3 kg (360 lb), SpO2 96 %.  PHYSICAL EXAMINATION:  GENERAL:  75 y.o.-year-old Obese patient lying in the bed in no acute distress.  EYES: Pupils equal,  round, reactive to light and accommodation. No scleral icterus. Extraocular muscles intact.  HEENT: Head atraumatic, normocephalic. Oropharynx and nasopharynx clear.  NECK:  Supple, no jugular venous distention. No thyroid enlargement, no tenderness.  LUNGS: Normal breath sounds bilaterally, no wheezing, bibasilar rales, No rhonchi . No use of accessory muscles of respiration.  CARDIOVASCULAR: S1, S2, RRR. No murmurs, rubs, gallops, clicks.  ABDOMEN: Soft, nontender, nondistended. Bowel sounds present. No organomegaly or mass.  EXTREMITIES: +1 pedal edema b/l, No cyanosis, or clubbing.  NEUROLOGIC: Cranial nerves II through XII are intact. No focal motor or sensory deficits appreciated bilaterally.  Globally weak PSYCHIATRIC: The patient is alert and oriented x 3. Good affect SKIN: No obvious rash, lesion, or ulcer.   LABORATORY PANEL:   CBC  Recent Labs Lab 05/26/16 1442  WBC 10.7*  HGB 13.5  HCT 40.3  PLT 241   ------------------------------------------------------------------------------------------------------------------  Chemistries   Recent Labs Lab  05/26/16 1442  NA 141  K 4.0  CL 95*  CO2 38*  GLUCOSE 135*  BUN 17  CREATININE 0.62  CALCIUM 8.7*   ------------------------------------------------------------------------------------------------------------------  Cardiac Enzymes  Recent Labs Lab 05/26/16 1442  TROPONINI <0.03   ------------------------------------------------------------------------------------------------------------------  RADIOLOGY:  Dg Chest 2 View  Result Date: 05/26/2016 CLINICAL DATA:  Shortness of breath.  Home oxygen. EXAM: CHEST  2 VIEW COMPARISON:  04/24/2014 CT chest FINDINGS: Blunting of both costophrenic angles with bandlike densities along the hemidiaphragms favoring atelectasis over pneumonia. There is some fluid in the minor fissure. Mild enlargement of the cardiopericardial silhouette. Chronic bony deformity of the left  lower scapula. IMPRESSION: 1. Small bilateral pleural effusions with suspected atelectasis along the hemidiaphragms. Trace fluid in the right minor fissure. 2. Mild enlargement of the cardiopericardial silhouette. Electronically Signed   By: Gaylyn RongWalter  Liebkemann M.D.   On: 05/26/2016 14:31     IMPRESSION AND PLAN:   75 year old male with past medical history of chronic fibrillation, diastolic CHF, hypertension, polymyalgia rheumatica, hyperlipidemia, BPH who presented to the hospital due to shortness of breath and weakness.   1. Shortness of breath/hypoxia-this is probably related to underlying atelectasis. Patient does have bilateral pleural effusions and chest x-ray but clinically he has no evidence of overt congestive heart failure. -He has baseline orthopnea which has not gotten worse, he takes Lasix on a when necessary basis. -Patient would prefer to be discharged home with follow-up with cardiology as an outpatient. I did extend to the patient that we could observe overnight and have a cardiologist see him here but he prefers to go home and would come back to the hospital if symptoms get worse. -He is currently only on 2 L nasal cannula with O2 sats in the 90s to 100%. -I will give him incentive spirometer to take at home to see if this helps with his atelectasis/hypoxia.  2. Hypotension-patient had some episodic hypotension with systolic blood pressures in 90s. It has improved now to his baseline blood pressure in the low 100s -He does not appear to be cardiogenic shock or septic shock.  3. History of chronic atrial fibrillation-currently rate controlled. He will continue his Toprol, continue Xarelto.  4. Diabetes-patient will continue his metformin.  5. History of polymyalgia rheumatica-he will continue his prednisone.  Patient is going to be discharged home in the emergency room and patient and his daughter are okay with this plan, he was advised to come back to the emergency room if  his symptoms get worse and he is aware of that.  All the records are reviewed and case discussed with Consulting provider. Management plans discussed with the patient, family and they are in agreement.  CODE STATUS: Full  TOTAL TIME TAKING CARE OF THIS PATIENT: 50 minutes.    Houston SirenSAINANI,VIVEK J M.D on 05/26/2016 at 4:34 PM  Between 7am to 6pm - Pager - [phone]  After 6pm go to [email or web address] - password [email or web address] [facility]  [facility]  Office  [phone]  CC: Primary care Physician: [facility]

## 2016-05-26 NOTE — ED Notes (Signed)
Pt assisted to bedside commode with 2 person assist. Given call bell and informed to call out when he is done.

## 2016-10-30 DEATH — deceased

## 2017-02-26 IMAGING — US US CAROTID DUPLEX BILAT
1 series · 13 of 24 positions shown · non-contrast
Comparison: Head CT 08/24/2015

CLINICAL DATA: 74-year-old male with symptoms of transient ischemic
attack

EXAM:
BILATERAL CAROTID DUPLEX ULTRASOUND
TECHNIQUE: Gray scale imaging, color Doppler and duplex ultrasound were
performed of bilateral carotid and vertebral arteries in the neck.

[Series 1: us carotid duplex bilat · 13 of 72 slices shown]
[im 1/72]
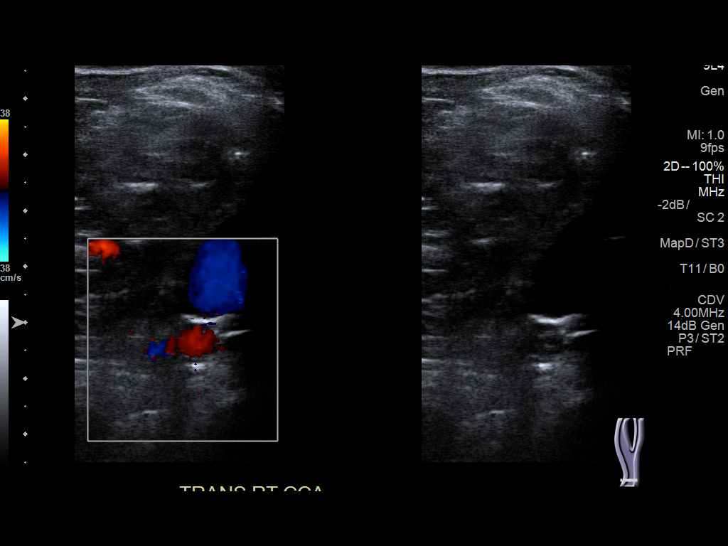
[im 7/72]
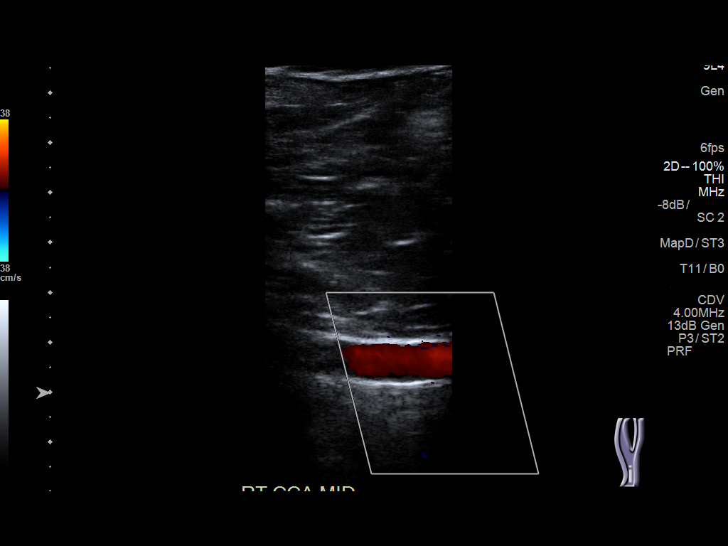
[im 13/72]
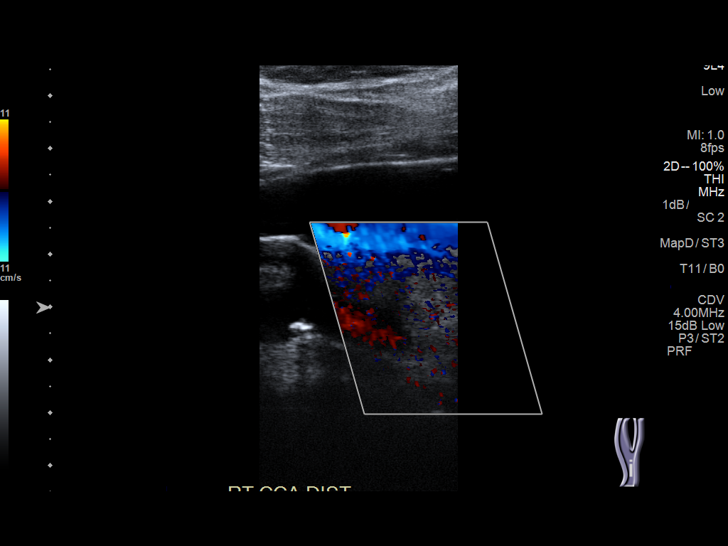
[im 19/72]
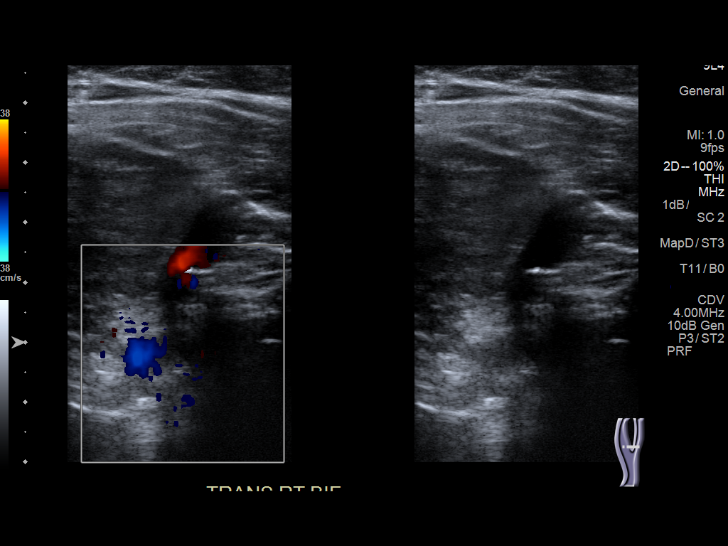
[im 25/72]
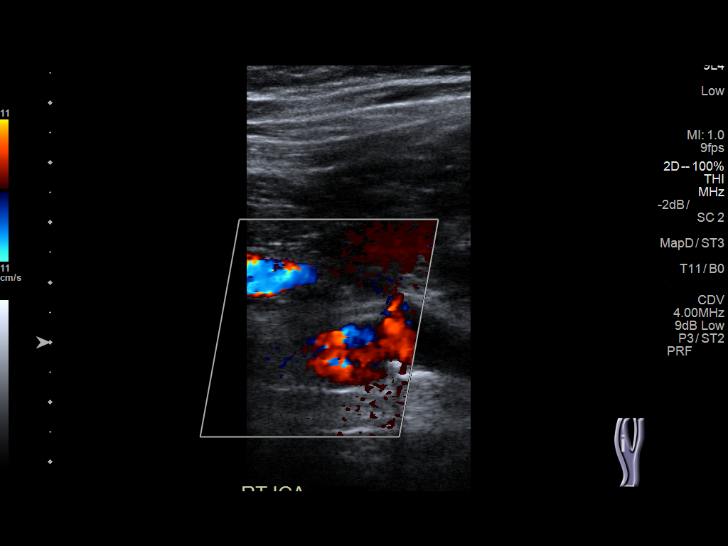
[im 31/72]
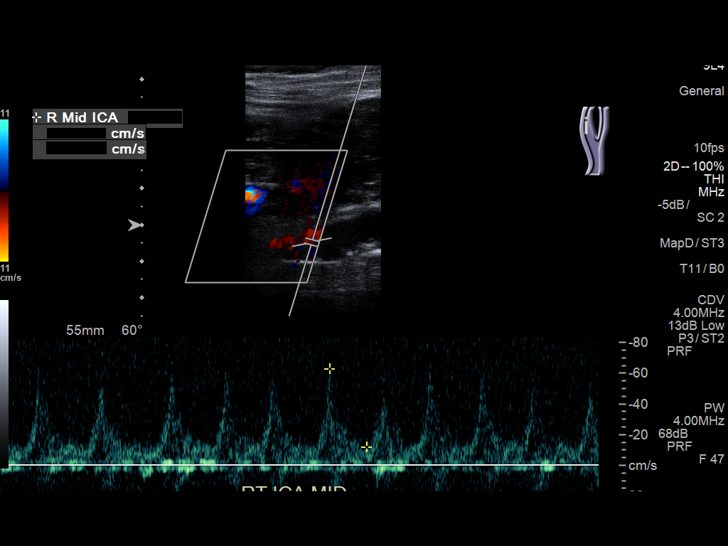
[im 38/72]
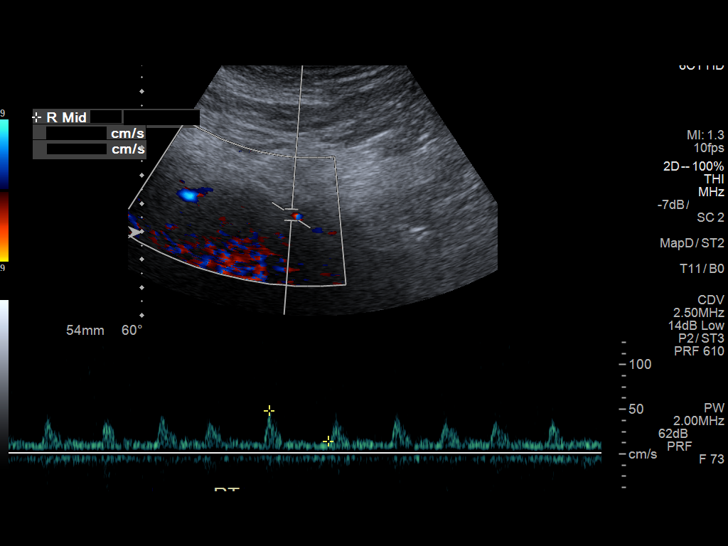
[im 41/72]
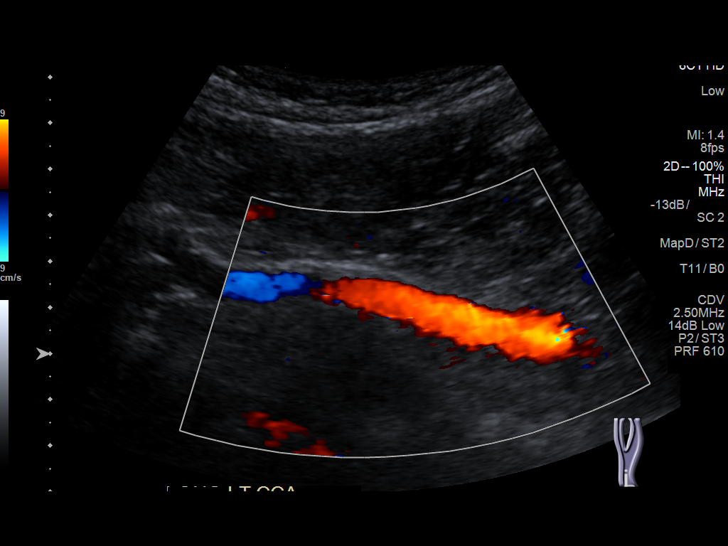
[im 47/72]
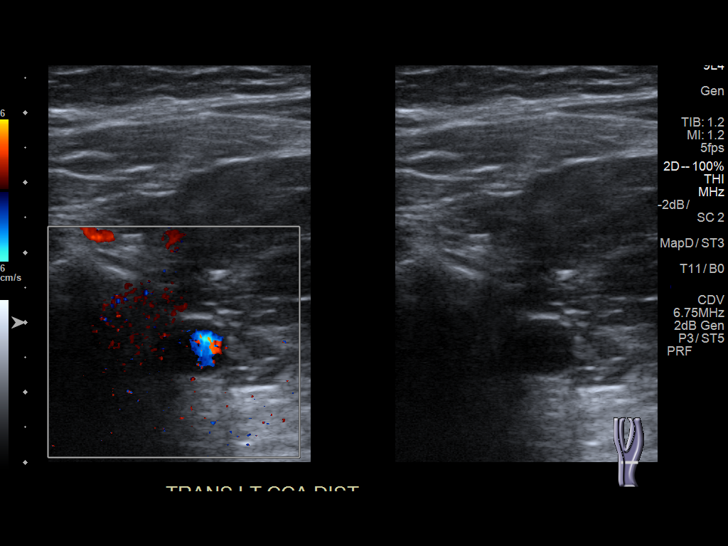
[im 53/72]
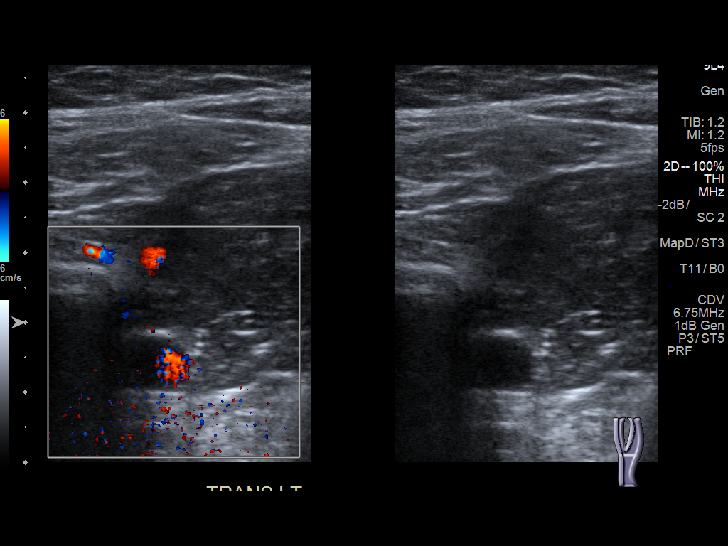
[im 59/72]
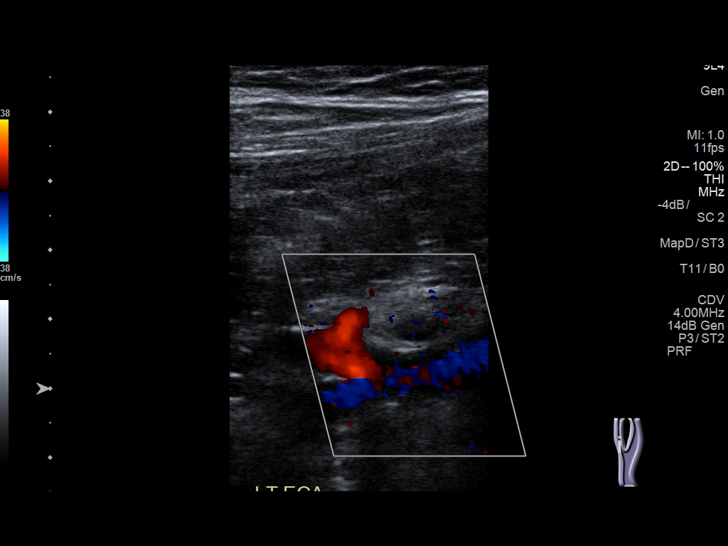
[im 65/72]
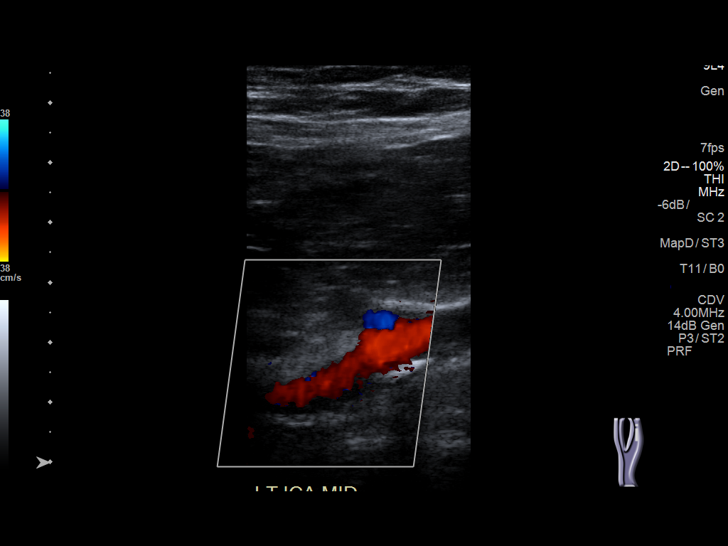
[im 72/72]
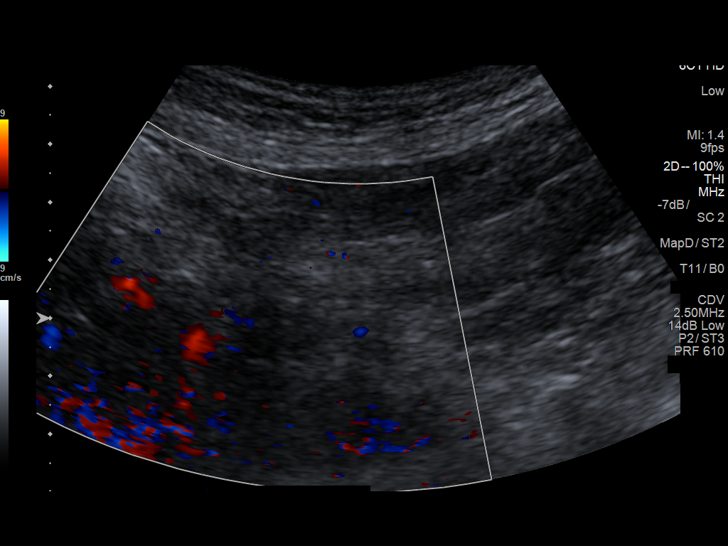

[13 of 24 positions shown; findings below may reference images not displayed]

FINDINGS: Criteria: Quantification of carotid stenosis is based on velocity
parameters that correlate the residual internal carotid diameter
with NASCET-based stenosis levels, using the diameter of the distal
internal carotid lumen as the denominator for stenosis measurement.

The following velocity measurements were obtained:

RIGHT

ICA:  69/9 cm/sec

CCA:  66/11 cm/sec

SYSTOLIC ICA/CCA RATIO:

DIASTOLIC ICA/CCA RATIO:

ECA:  63 cm/sec

LEFT

ICA:  87/14 cm/sec

CCA:  113/20 cm/sec

SYSTOLIC ICA/CCA RATIO:

DIASTOLIC ICA/CCA RATIO:

ECA:  82 cm/sec

RIGHT CAROTID ARTERY: Small focal heterogeneous atherosclerotic
plaque in the proximal internal carotid artery. By peak systolic
velocity criteria the estimated stenosis remains less than 50%.

RIGHT VERTEBRAL ARTERY:  Patent with antegrade flow.

LEFT CAROTID ARTERY: Trace heterogeneous atherosclerotic plaque in
the carotid bifurcation and proximal internal carotid artery. By
peak systolic velocity criteria, the estimated stenosis remains less
than 50%.

LEFT VERTEBRAL ARTERY:  Patent with antegrade flow.
IMPRESSION: 1. Mild (1-49%) stenosis proximal right internal carotid artery
secondary to focal small heterogeneous atherosclerotic plaque.
2. Mild (1-49%) stenosis proximal left internal carotid artery
secondary to mild heterogeneous atherosclerotic plaque.
3. Vertebral arteries are patent with normal antegrade flow.

## 2018-02-17 IMAGING — CR DG CHEST 2V
3 series · 3 of 3 positions shown · non-contrast
Comparison: 04/24/2014 CT chest

CLINICAL DATA: Shortness of breath.  Home oxygen.

EXAM:
CHEST  2 VIEW

[chest lat (1 of 2)]
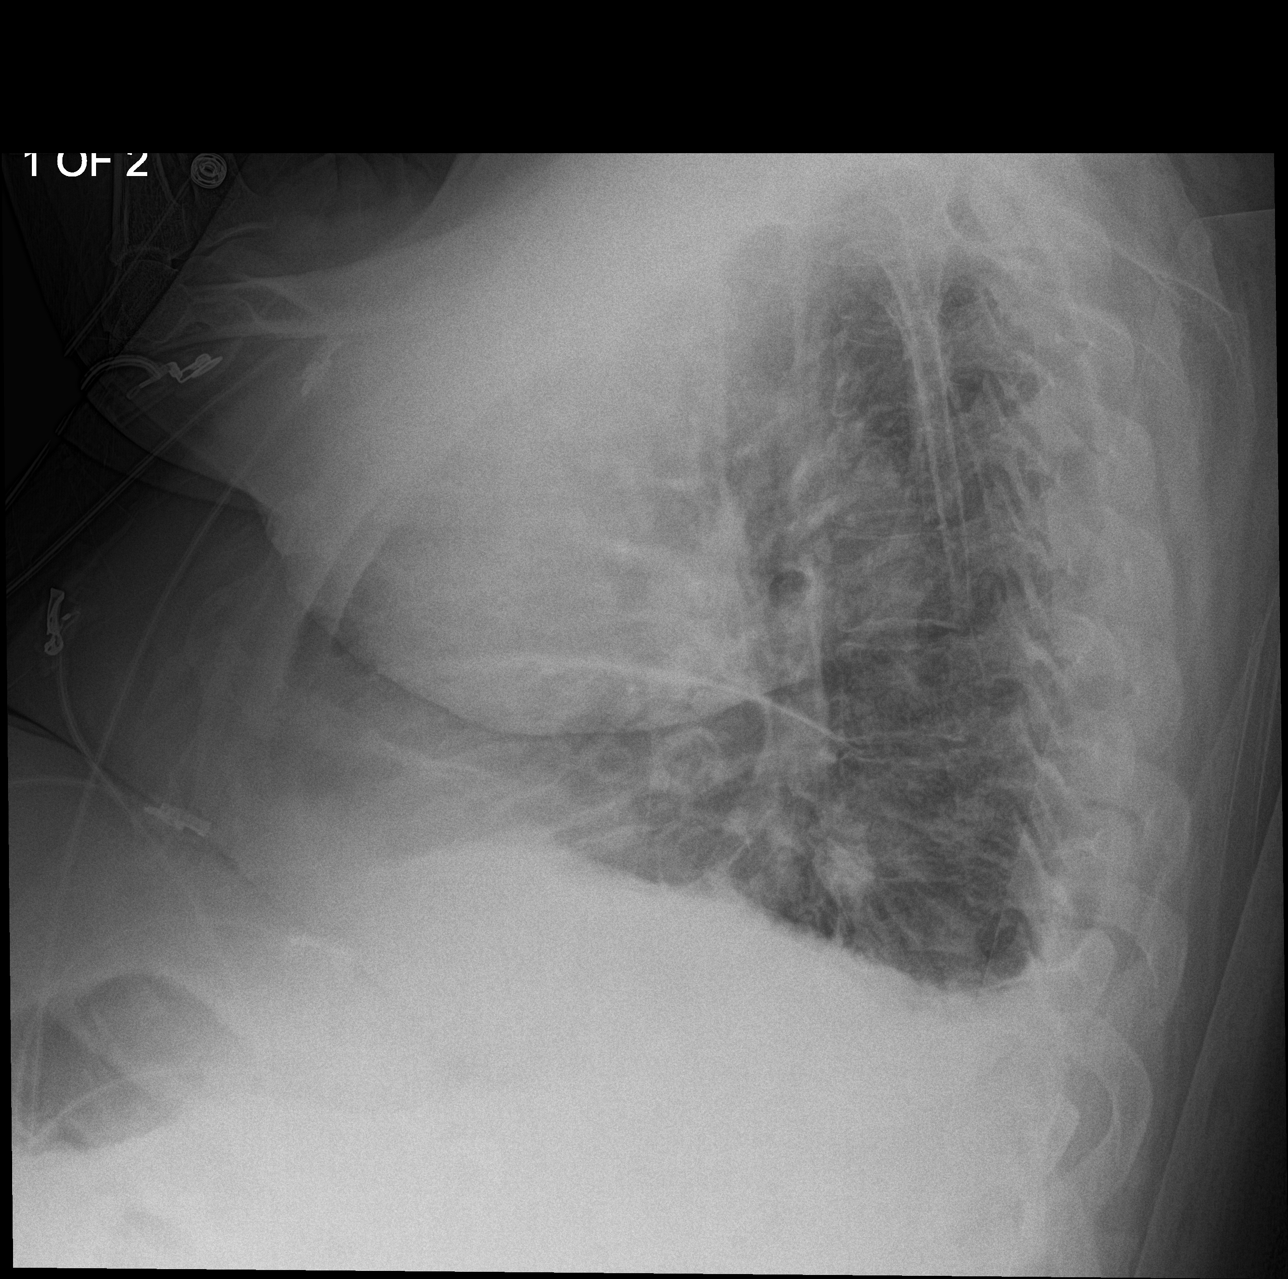

[chest ap]
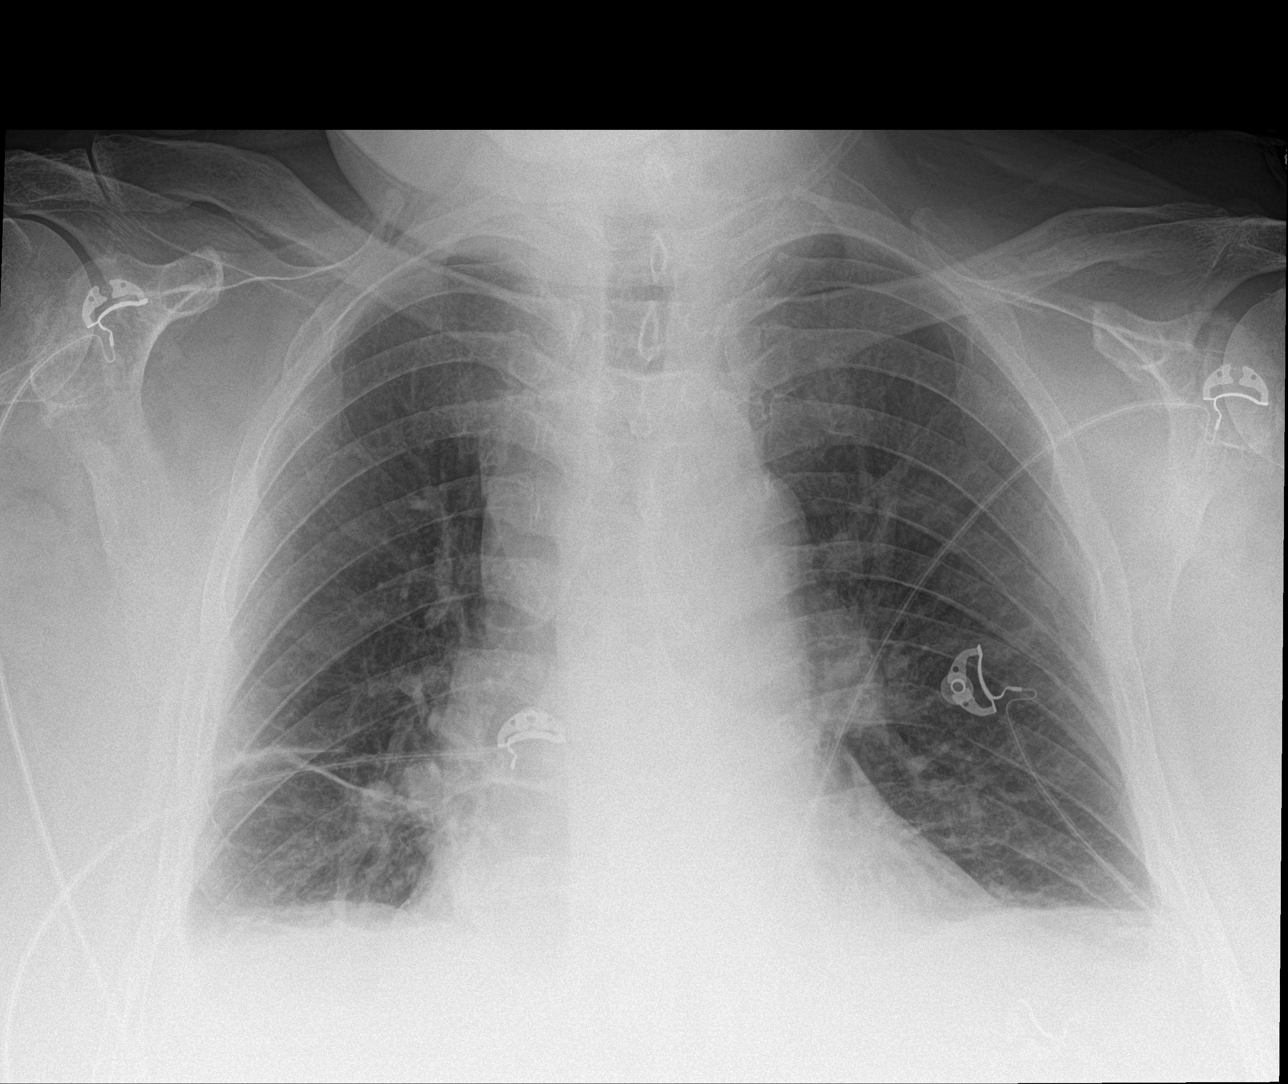

[chest lat (2 of 2)]
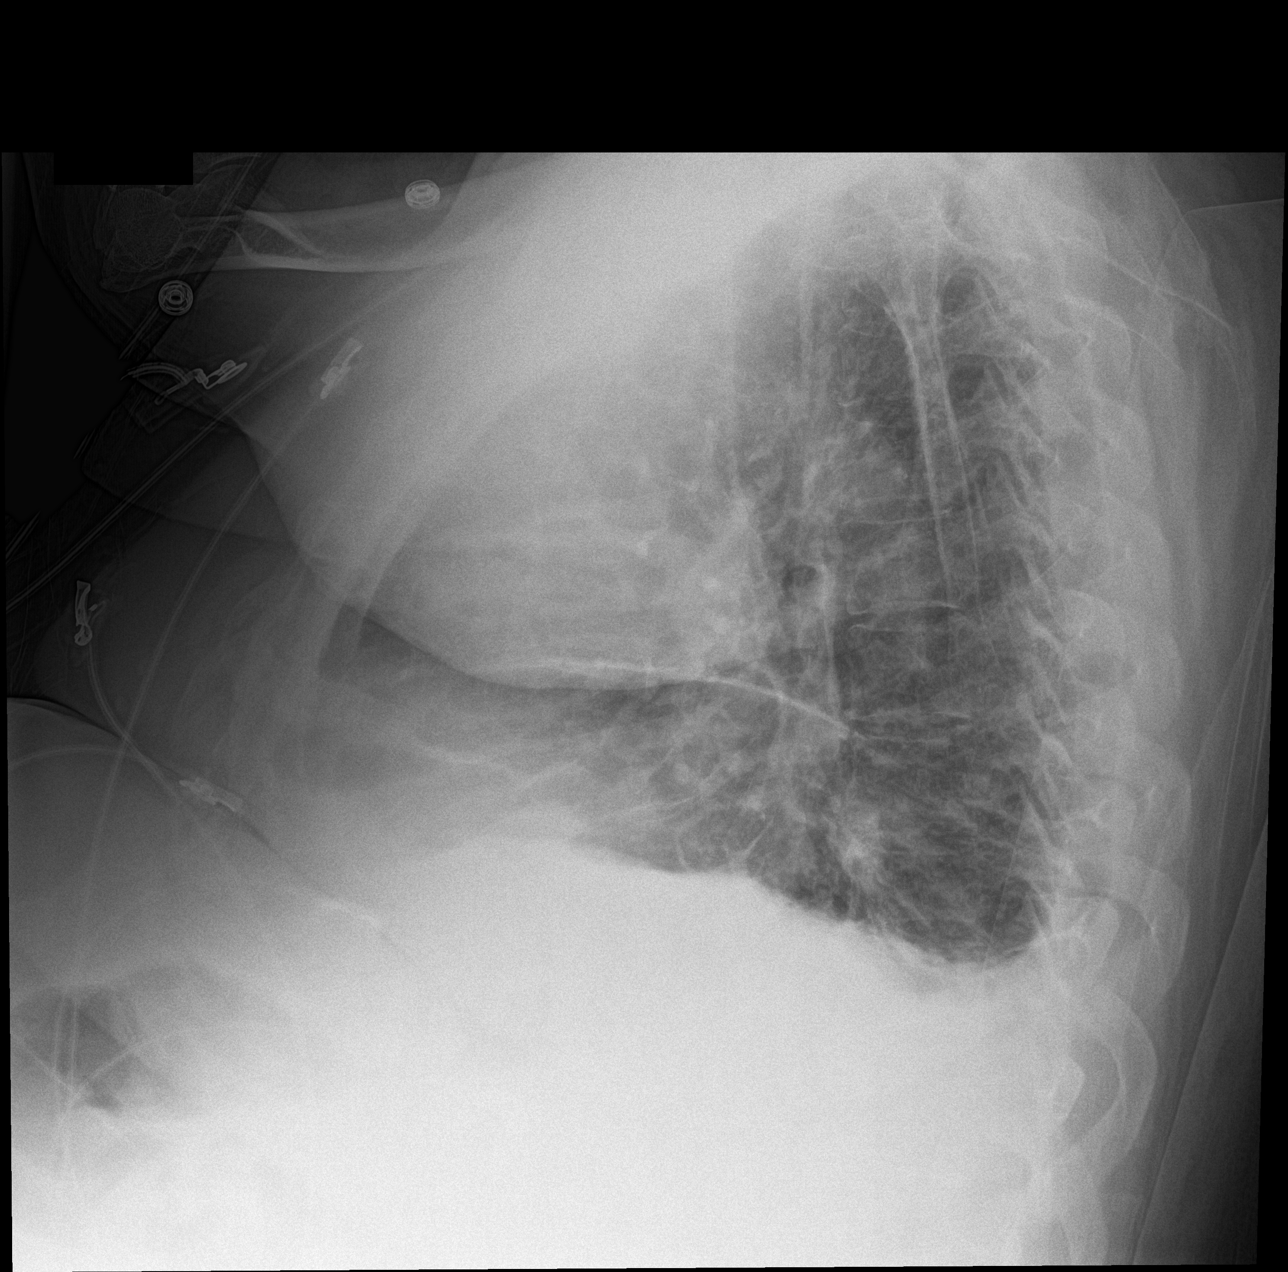

[3 of 3 positions shown; findings below may reference images not displayed]

FINDINGS: Blunting of both costophrenic angles with bandlike densities along
the hemidiaphragms favoring atelectasis over pneumonia. There is
some fluid in the minor fissure. Mild enlargement of the
cardiopericardial silhouette. Chronic bony deformity of the left
lower scapula.
IMPRESSION: 1. Small bilateral pleural effusions with suspected atelectasis
along the hemidiaphragms. Trace fluid in the right minor fissure.
2. Mild enlargement of the cardiopericardial silhouette.
